# Patient Record
Sex: Female | Born: 1940 | Race: White | Hispanic: No | Smoking: Never smoker
Health system: Southern US, Community
[De-identification: ages and names within clinical notes are randomized; demographics above are authoritative.]

## PROBLEM LIST (undated history)

## (undated) DIAGNOSIS — E785 Hyperlipidemia, unspecified: Secondary | ICD-10-CM

## (undated) DIAGNOSIS — G43909 Migraine, unspecified, not intractable, without status migrainosus: Secondary | ICD-10-CM

## (undated) DIAGNOSIS — I10 Essential (primary) hypertension: Secondary | ICD-10-CM

## (undated) DIAGNOSIS — M170 Bilateral primary osteoarthritis of knee: Secondary | ICD-10-CM

## (undated) HISTORY — DX: Bilateral primary osteoarthritis of knee: M17.0

## (undated) HISTORY — PX: LEFT OOPHORECTOMY: SHX1961

## (undated) HISTORY — PX: CHOLECYSTECTOMY: SHX55

## (undated) HISTORY — DX: Migraine, unspecified, not intractable, without status migrainosus: G43.909

## (undated) HISTORY — DX: Essential (primary) hypertension: I10

## (undated) HISTORY — DX: Hyperlipidemia, unspecified: E78.5

## (undated) HISTORY — PX: BIOPSY BREAST: PRO8

---

## 1977-12-24 HISTORY — PX: VAGINAL HYSTERECTOMY: SHX2639

## 1998-12-24 HISTORY — PX: DG GALL BLADDER: HXRAD326

## 2016-12-24 HISTORY — PX: ABSCESS DRAINAGE: SHX1119

## 2017-01-29 ENCOUNTER — Other Ambulatory Visit: Payer: Self-pay | Admitting: Family Medicine

## 2017-01-29 DIAGNOSIS — M8580 Other specified disorders of bone density and structure, unspecified site: Secondary | ICD-10-CM

## 2017-01-31 ENCOUNTER — Other Ambulatory Visit: Payer: Self-pay

## 2017-02-01 ENCOUNTER — Other Ambulatory Visit: Payer: Self-pay

## 2017-02-04 ENCOUNTER — Ambulatory Visit
Admission: RE | Admit: 2017-02-04 | Discharge: 2017-02-04 | Disposition: A | Discharge status: Home / Self Care | Payer: Medicare Other | Source: Ambulatory Visit | Attending: Family Medicine | Admitting: Family Medicine

## 2017-02-04 DIAGNOSIS — M8580 Other specified disorders of bone density and structure, unspecified site: Secondary | ICD-10-CM

## 2019-11-23 ENCOUNTER — Other Ambulatory Visit: Payer: Self-pay

## 2019-11-23 DIAGNOSIS — Z20822 Contact with and (suspected) exposure to covid-19: Secondary | ICD-10-CM

## 2019-11-25 ENCOUNTER — Telehealth: Payer: Self-pay | Admitting: General Practice

## 2019-11-25 LAB — NOVEL CORONAVIRUS, NAA: SARS-CoV-2, NAA: NOT DETECTED

## 2019-11-25 NOTE — Telephone Encounter (Signed)
Negative COVID results given. Patient results "NOT Detected." Caller expressed understanding. ° °

## 2020-09-05 DIAGNOSIS — H25811 Combined forms of age-related cataract, right eye: Secondary | ICD-10-CM | POA: Diagnosis not present

## 2020-09-05 DIAGNOSIS — D3132 Benign neoplasm of left choroid: Secondary | ICD-10-CM | POA: Diagnosis not present

## 2020-09-13 DIAGNOSIS — Z23 Encounter for immunization: Secondary | ICD-10-CM | POA: Diagnosis not present

## 2020-10-10 DIAGNOSIS — G43909 Migraine, unspecified, not intractable, without status migrainosus: Secondary | ICD-10-CM | POA: Diagnosis not present

## 2020-10-10 DIAGNOSIS — F4321 Adjustment disorder with depressed mood: Secondary | ICD-10-CM | POA: Diagnosis not present

## 2020-10-10 DIAGNOSIS — T22319A Burn of third degree of unspecified forearm, initial encounter: Secondary | ICD-10-CM | POA: Diagnosis not present

## 2020-10-10 DIAGNOSIS — G4700 Insomnia, unspecified: Secondary | ICD-10-CM | POA: Diagnosis not present

## 2020-10-10 DIAGNOSIS — R000 Tachycardia, unspecified: Secondary | ICD-10-CM | POA: Diagnosis not present

## 2020-10-11 DIAGNOSIS — N3000 Acute cystitis without hematuria: Secondary | ICD-10-CM | POA: Diagnosis not present

## 2020-10-24 DIAGNOSIS — L720 Epidermal cyst: Secondary | ICD-10-CM | POA: Diagnosis not present

## 2020-10-24 DIAGNOSIS — L820 Inflamed seborrheic keratosis: Secondary | ICD-10-CM | POA: Diagnosis not present

## 2020-10-24 DIAGNOSIS — L821 Other seborrheic keratosis: Secondary | ICD-10-CM | POA: Diagnosis not present

## 2020-11-01 DIAGNOSIS — M170 Bilateral primary osteoarthritis of knee: Secondary | ICD-10-CM | POA: Diagnosis not present

## 2020-11-01 DIAGNOSIS — M7581 Other shoulder lesions, right shoulder: Secondary | ICD-10-CM | POA: Diagnosis not present

## 2020-12-24 HISTORY — PX: OTHER SURGICAL HISTORY: SHX169

## 2021-02-07 DIAGNOSIS — D485 Neoplasm of uncertain behavior of skin: Secondary | ICD-10-CM | POA: Diagnosis not present

## 2021-02-07 DIAGNOSIS — C44519 Basal cell carcinoma of skin of other part of trunk: Secondary | ICD-10-CM | POA: Diagnosis not present

## 2021-02-23 DIAGNOSIS — F4321 Adjustment disorder with depressed mood: Secondary | ICD-10-CM | POA: Diagnosis not present

## 2021-02-23 DIAGNOSIS — Z0000 Encounter for general adult medical examination without abnormal findings: Secondary | ICD-10-CM | POA: Diagnosis not present

## 2021-02-23 DIAGNOSIS — M19049 Primary osteoarthritis, unspecified hand: Secondary | ICD-10-CM | POA: Diagnosis not present

## 2021-02-23 DIAGNOSIS — G5603 Carpal tunnel syndrome, bilateral upper limbs: Secondary | ICD-10-CM | POA: Diagnosis not present

## 2021-02-23 DIAGNOSIS — M1990 Unspecified osteoarthritis, unspecified site: Secondary | ICD-10-CM | POA: Diagnosis not present

## 2021-02-23 DIAGNOSIS — Z1339 Encounter for screening examination for other mental health and behavioral disorders: Secondary | ICD-10-CM | POA: Diagnosis not present

## 2021-02-23 DIAGNOSIS — M170 Bilateral primary osteoarthritis of knee: Secondary | ICD-10-CM | POA: Diagnosis not present

## 2021-02-23 DIAGNOSIS — Z1331 Encounter for screening for depression: Secondary | ICD-10-CM | POA: Diagnosis not present

## 2021-02-23 DIAGNOSIS — C4491 Basal cell carcinoma of skin, unspecified: Secondary | ICD-10-CM | POA: Diagnosis not present

## 2021-03-22 DIAGNOSIS — L905 Scar conditions and fibrosis of skin: Secondary | ICD-10-CM | POA: Diagnosis not present

## 2021-03-22 DIAGNOSIS — C44519 Basal cell carcinoma of skin of other part of trunk: Secondary | ICD-10-CM | POA: Diagnosis not present

## 2021-04-11 DIAGNOSIS — E7800 Pure hypercholesterolemia, unspecified: Secondary | ICD-10-CM | POA: Diagnosis not present

## 2021-04-11 DIAGNOSIS — E559 Vitamin D deficiency, unspecified: Secondary | ICD-10-CM | POA: Diagnosis not present

## 2021-04-11 DIAGNOSIS — R5383 Other fatigue: Secondary | ICD-10-CM | POA: Diagnosis not present

## 2021-04-13 ENCOUNTER — Other Ambulatory Visit: Payer: Self-pay

## 2021-04-13 ENCOUNTER — Other Ambulatory Visit: Payer: Self-pay | Admitting: Family Medicine

## 2021-04-13 ENCOUNTER — Ambulatory Visit
Admission: RE | Admit: 2021-04-13 | Discharge: 2021-04-13 | Disposition: A | Discharge status: Home / Self Care | Payer: Medicare PPO | Source: Ambulatory Visit | Attending: Family Medicine | Admitting: Family Medicine

## 2021-04-13 DIAGNOSIS — M170 Bilateral primary osteoarthritis of knee: Secondary | ICD-10-CM

## 2021-04-13 DIAGNOSIS — E559 Vitamin D deficiency, unspecified: Secondary | ICD-10-CM | POA: Diagnosis not present

## 2021-04-13 DIAGNOSIS — M1711 Unilateral primary osteoarthritis, right knee: Secondary | ICD-10-CM | POA: Diagnosis not present

## 2021-04-13 DIAGNOSIS — F411 Generalized anxiety disorder: Secondary | ICD-10-CM | POA: Diagnosis not present

## 2021-04-13 DIAGNOSIS — E782 Mixed hyperlipidemia: Secondary | ICD-10-CM | POA: Diagnosis not present

## 2021-04-13 DIAGNOSIS — G5603 Carpal tunnel syndrome, bilateral upper limbs: Secondary | ICD-10-CM | POA: Diagnosis not present

## 2021-04-13 DIAGNOSIS — Z23 Encounter for immunization: Secondary | ICD-10-CM | POA: Diagnosis not present

## 2021-04-13 DIAGNOSIS — M1712 Unilateral primary osteoarthritis, left knee: Secondary | ICD-10-CM | POA: Diagnosis not present

## 2021-08-18 DIAGNOSIS — M545 Low back pain, unspecified: Secondary | ICD-10-CM | POA: Diagnosis not present

## 2021-08-18 DIAGNOSIS — M25551 Pain in right hip: Secondary | ICD-10-CM | POA: Diagnosis not present

## 2021-08-29 DIAGNOSIS — M545 Low back pain, unspecified: Secondary | ICD-10-CM | POA: Diagnosis not present

## 2021-08-29 DIAGNOSIS — M25559 Pain in unspecified hip: Secondary | ICD-10-CM | POA: Diagnosis not present

## 2021-09-05 DIAGNOSIS — S32030A Wedge compression fracture of third lumbar vertebra, initial encounter for closed fracture: Secondary | ICD-10-CM | POA: Diagnosis not present

## 2021-09-05 DIAGNOSIS — M4316 Spondylolisthesis, lumbar region: Secondary | ICD-10-CM | POA: Diagnosis not present

## 2021-09-06 DIAGNOSIS — Z23 Encounter for immunization: Secondary | ICD-10-CM | POA: Diagnosis not present

## 2021-09-06 DIAGNOSIS — S32030A Wedge compression fracture of third lumbar vertebra, initial encounter for closed fracture: Secondary | ICD-10-CM | POA: Diagnosis not present

## 2021-09-06 DIAGNOSIS — K5790 Diverticulosis of intestine, part unspecified, without perforation or abscess without bleeding: Secondary | ICD-10-CM | POA: Diagnosis not present

## 2021-09-06 DIAGNOSIS — S32050A Wedge compression fracture of fifth lumbar vertebra, initial encounter for closed fracture: Secondary | ICD-10-CM | POA: Diagnosis not present

## 2021-09-06 DIAGNOSIS — M419 Scoliosis, unspecified: Secondary | ICD-10-CM | POA: Diagnosis not present

## 2021-09-06 DIAGNOSIS — E559 Vitamin D deficiency, unspecified: Secondary | ICD-10-CM | POA: Diagnosis not present

## 2021-09-06 DIAGNOSIS — M810 Age-related osteoporosis without current pathological fracture: Secondary | ICD-10-CM | POA: Diagnosis not present

## 2021-09-06 DIAGNOSIS — M8580 Other specified disorders of bone density and structure, unspecified site: Secondary | ICD-10-CM | POA: Diagnosis not present

## 2021-09-06 DIAGNOSIS — R030 Elevated blood-pressure reading, without diagnosis of hypertension: Secondary | ICD-10-CM | POA: Diagnosis not present

## 2021-09-07 DIAGNOSIS — H25811 Combined forms of age-related cataract, right eye: Secondary | ICD-10-CM | POA: Diagnosis not present

## 2021-09-07 DIAGNOSIS — D3132 Benign neoplasm of left choroid: Secondary | ICD-10-CM | POA: Diagnosis not present

## 2021-09-27 DIAGNOSIS — Z23 Encounter for immunization: Secondary | ICD-10-CM | POA: Diagnosis not present

## 2021-10-17 DIAGNOSIS — F411 Generalized anxiety disorder: Secondary | ICD-10-CM | POA: Diagnosis not present

## 2021-10-17 DIAGNOSIS — G4700 Insomnia, unspecified: Secondary | ICD-10-CM | POA: Diagnosis not present

## 2021-10-17 DIAGNOSIS — E559 Vitamin D deficiency, unspecified: Secondary | ICD-10-CM | POA: Diagnosis not present

## 2021-10-17 DIAGNOSIS — F331 Major depressive disorder, recurrent, moderate: Secondary | ICD-10-CM | POA: Diagnosis not present

## 2021-10-17 DIAGNOSIS — M810 Age-related osteoporosis without current pathological fracture: Secondary | ICD-10-CM | POA: Diagnosis not present

## 2021-10-17 DIAGNOSIS — M170 Bilateral primary osteoarthritis of knee: Secondary | ICD-10-CM | POA: Diagnosis not present

## 2021-10-17 DIAGNOSIS — S32030A Wedge compression fracture of third lumbar vertebra, initial encounter for closed fracture: Secondary | ICD-10-CM | POA: Diagnosis not present

## 2021-10-25 DIAGNOSIS — S76019D Strain of muscle, fascia and tendon of unspecified hip, subsequent encounter: Secondary | ICD-10-CM | POA: Diagnosis not present

## 2021-10-25 DIAGNOSIS — R030 Elevated blood-pressure reading, without diagnosis of hypertension: Secondary | ICD-10-CM | POA: Diagnosis not present

## 2021-10-25 DIAGNOSIS — Z6832 Body mass index (BMI) 32.0-32.9, adult: Secondary | ICD-10-CM | POA: Diagnosis not present

## 2021-10-25 DIAGNOSIS — S32030A Wedge compression fracture of third lumbar vertebra, initial encounter for closed fracture: Secondary | ICD-10-CM | POA: Diagnosis not present

## 2021-11-01 DIAGNOSIS — M170 Bilateral primary osteoarthritis of knee: Secondary | ICD-10-CM | POA: Diagnosis not present

## 2021-11-03 DIAGNOSIS — S76019D Strain of muscle, fascia and tendon of unspecified hip, subsequent encounter: Secondary | ICD-10-CM | POA: Diagnosis not present

## 2021-11-03 DIAGNOSIS — M25559 Pain in unspecified hip: Secondary | ICD-10-CM | POA: Diagnosis not present

## 2021-11-07 DIAGNOSIS — S76019D Strain of muscle, fascia and tendon of unspecified hip, subsequent encounter: Secondary | ICD-10-CM | POA: Diagnosis not present

## 2021-11-07 DIAGNOSIS — M25559 Pain in unspecified hip: Secondary | ICD-10-CM | POA: Diagnosis not present

## 2021-11-08 DIAGNOSIS — M170 Bilateral primary osteoarthritis of knee: Secondary | ICD-10-CM | POA: Diagnosis not present

## 2021-11-09 DIAGNOSIS — S76019D Strain of muscle, fascia and tendon of unspecified hip, subsequent encounter: Secondary | ICD-10-CM | POA: Diagnosis not present

## 2021-11-09 DIAGNOSIS — M25559 Pain in unspecified hip: Secondary | ICD-10-CM | POA: Diagnosis not present

## 2021-11-15 DIAGNOSIS — I10 Essential (primary) hypertension: Secondary | ICD-10-CM | POA: Diagnosis not present

## 2021-11-15 DIAGNOSIS — M170 Bilateral primary osteoarthritis of knee: Secondary | ICD-10-CM | POA: Diagnosis not present

## 2021-11-20 DIAGNOSIS — S76019D Strain of muscle, fascia and tendon of unspecified hip, subsequent encounter: Secondary | ICD-10-CM | POA: Diagnosis not present

## 2021-11-20 DIAGNOSIS — M545 Low back pain, unspecified: Secondary | ICD-10-CM | POA: Diagnosis not present

## 2021-11-21 DIAGNOSIS — S76019D Strain of muscle, fascia and tendon of unspecified hip, subsequent encounter: Secondary | ICD-10-CM | POA: Diagnosis not present

## 2021-11-21 DIAGNOSIS — M25559 Pain in unspecified hip: Secondary | ICD-10-CM | POA: Diagnosis not present

## 2021-11-23 DIAGNOSIS — M25559 Pain in unspecified hip: Secondary | ICD-10-CM | POA: Diagnosis not present

## 2021-11-23 DIAGNOSIS — S76019D Strain of muscle, fascia and tendon of unspecified hip, subsequent encounter: Secondary | ICD-10-CM | POA: Diagnosis not present

## 2021-11-29 DIAGNOSIS — M25559 Pain in unspecified hip: Secondary | ICD-10-CM | POA: Diagnosis not present

## 2021-11-29 DIAGNOSIS — S76019D Strain of muscle, fascia and tendon of unspecified hip, subsequent encounter: Secondary | ICD-10-CM | POA: Diagnosis not present

## 2021-12-08 DIAGNOSIS — S32030D Wedge compression fracture of third lumbar vertebra, subsequent encounter for fracture with routine healing: Secondary | ICD-10-CM | POA: Diagnosis not present

## 2021-12-19 DIAGNOSIS — R002 Palpitations: Secondary | ICD-10-CM | POA: Diagnosis not present

## 2021-12-19 DIAGNOSIS — R21 Rash and other nonspecific skin eruption: Secondary | ICD-10-CM | POA: Diagnosis not present

## 2021-12-19 DIAGNOSIS — M539 Dorsopathy, unspecified: Secondary | ICD-10-CM | POA: Diagnosis not present

## 2021-12-19 DIAGNOSIS — I10 Essential (primary) hypertension: Secondary | ICD-10-CM | POA: Diagnosis not present

## 2021-12-19 DIAGNOSIS — R600 Localized edema: Secondary | ICD-10-CM | POA: Diagnosis not present

## 2021-12-19 DIAGNOSIS — F411 Generalized anxiety disorder: Secondary | ICD-10-CM | POA: Diagnosis not present

## 2021-12-19 DIAGNOSIS — Z23 Encounter for immunization: Secondary | ICD-10-CM | POA: Diagnosis not present

## 2021-12-19 DIAGNOSIS — N952 Postmenopausal atrophic vaginitis: Secondary | ICD-10-CM | POA: Diagnosis not present

## 2022-02-13 DIAGNOSIS — M19049 Primary osteoarthritis, unspecified hand: Secondary | ICD-10-CM | POA: Diagnosis not present

## 2022-02-13 DIAGNOSIS — S32030A Wedge compression fracture of third lumbar vertebra, initial encounter for closed fracture: Secondary | ICD-10-CM | POA: Diagnosis not present

## 2022-02-13 DIAGNOSIS — F331 Major depressive disorder, recurrent, moderate: Secondary | ICD-10-CM | POA: Diagnosis not present

## 2022-02-13 DIAGNOSIS — M170 Bilateral primary osteoarthritis of knee: Secondary | ICD-10-CM | POA: Diagnosis not present

## 2022-02-13 DIAGNOSIS — K589 Irritable bowel syndrome without diarrhea: Secondary | ICD-10-CM | POA: Diagnosis not present

## 2022-02-13 DIAGNOSIS — I10 Essential (primary) hypertension: Secondary | ICD-10-CM | POA: Diagnosis not present

## 2022-02-15 DIAGNOSIS — L578 Other skin changes due to chronic exposure to nonionizing radiation: Secondary | ICD-10-CM | POA: Diagnosis not present

## 2022-02-15 DIAGNOSIS — L719 Rosacea, unspecified: Secondary | ICD-10-CM | POA: Diagnosis not present

## 2022-02-15 DIAGNOSIS — L821 Other seborrheic keratosis: Secondary | ICD-10-CM | POA: Diagnosis not present

## 2022-02-15 DIAGNOSIS — L814 Other melanin hyperpigmentation: Secondary | ICD-10-CM | POA: Diagnosis not present

## 2022-02-15 DIAGNOSIS — B079 Viral wart, unspecified: Secondary | ICD-10-CM | POA: Diagnosis not present

## 2022-02-15 DIAGNOSIS — L720 Epidermal cyst: Secondary | ICD-10-CM | POA: Diagnosis not present

## 2022-02-15 DIAGNOSIS — Z23 Encounter for immunization: Secondary | ICD-10-CM | POA: Diagnosis not present

## 2022-02-21 DIAGNOSIS — Z6834 Body mass index (BMI) 34.0-34.9, adult: Secondary | ICD-10-CM | POA: Diagnosis not present

## 2022-02-21 DIAGNOSIS — S32030D Wedge compression fracture of third lumbar vertebra, subsequent encounter for fracture with routine healing: Secondary | ICD-10-CM | POA: Diagnosis not present

## 2022-02-21 DIAGNOSIS — I10 Essential (primary) hypertension: Secondary | ICD-10-CM | POA: Diagnosis not present

## 2022-03-01 DIAGNOSIS — R197 Diarrhea, unspecified: Secondary | ICD-10-CM | POA: Diagnosis not present

## 2022-03-01 DIAGNOSIS — M8580 Other specified disorders of bone density and structure, unspecified site: Secondary | ICD-10-CM | POA: Diagnosis not present

## 2022-03-01 DIAGNOSIS — Z0000 Encounter for general adult medical examination without abnormal findings: Secondary | ICD-10-CM | POA: Diagnosis not present

## 2022-03-01 DIAGNOSIS — R5383 Other fatigue: Secondary | ICD-10-CM | POA: Diagnosis not present

## 2022-03-01 DIAGNOSIS — Z1331 Encounter for screening for depression: Secondary | ICD-10-CM | POA: Diagnosis not present

## 2022-03-01 DIAGNOSIS — E559 Vitamin D deficiency, unspecified: Secondary | ICD-10-CM | POA: Diagnosis not present

## 2022-03-01 DIAGNOSIS — Z1339 Encounter for screening examination for other mental health and behavioral disorders: Secondary | ICD-10-CM | POA: Diagnosis not present

## 2022-03-01 DIAGNOSIS — S32030A Wedge compression fracture of third lumbar vertebra, initial encounter for closed fracture: Secondary | ICD-10-CM | POA: Diagnosis not present

## 2022-03-14 ENCOUNTER — Other Ambulatory Visit: Payer: Self-pay | Admitting: Family Medicine

## 2022-03-14 DIAGNOSIS — E559 Vitamin D deficiency, unspecified: Secondary | ICD-10-CM

## 2022-03-14 DIAGNOSIS — M8580 Other specified disorders of bone density and structure, unspecified site: Secondary | ICD-10-CM

## 2022-06-06 DIAGNOSIS — R5383 Other fatigue: Secondary | ICD-10-CM | POA: Diagnosis not present

## 2022-06-06 DIAGNOSIS — E559 Vitamin D deficiency, unspecified: Secondary | ICD-10-CM | POA: Diagnosis not present

## 2022-06-07 DIAGNOSIS — J329 Chronic sinusitis, unspecified: Secondary | ICD-10-CM | POA: Diagnosis not present

## 2022-06-07 DIAGNOSIS — J069 Acute upper respiratory infection, unspecified: Secondary | ICD-10-CM | POA: Diagnosis not present

## 2022-06-07 DIAGNOSIS — B9689 Other specified bacterial agents as the cause of diseases classified elsewhere: Secondary | ICD-10-CM | POA: Diagnosis not present

## 2022-07-02 DIAGNOSIS — E559 Vitamin D deficiency, unspecified: Secondary | ICD-10-CM | POA: Diagnosis not present

## 2022-07-02 DIAGNOSIS — R5383 Other fatigue: Secondary | ICD-10-CM | POA: Diagnosis not present

## 2022-07-02 DIAGNOSIS — F331 Major depressive disorder, recurrent, moderate: Secondary | ICD-10-CM | POA: Diagnosis not present

## 2022-07-02 DIAGNOSIS — F411 Generalized anxiety disorder: Secondary | ICD-10-CM | POA: Diagnosis not present

## 2022-07-02 DIAGNOSIS — G4700 Insomnia, unspecified: Secondary | ICD-10-CM | POA: Diagnosis not present

## 2022-07-02 DIAGNOSIS — I10 Essential (primary) hypertension: Secondary | ICD-10-CM | POA: Diagnosis not present

## 2022-07-02 DIAGNOSIS — M810 Age-related osteoporosis without current pathological fracture: Secondary | ICD-10-CM | POA: Diagnosis not present

## 2022-08-07 DIAGNOSIS — D3132 Benign neoplasm of left choroid: Secondary | ICD-10-CM | POA: Diagnosis not present

## 2022-10-03 ENCOUNTER — Ambulatory Visit
Admission: RE | Admit: 2022-10-03 | Discharge: 2022-10-03 | Disposition: A | Discharge status: Home / Self Care | Payer: Medicare PPO | Source: Ambulatory Visit | Attending: Family Medicine | Admitting: Family Medicine

## 2022-10-03 DIAGNOSIS — M8580 Other specified disorders of bone density and structure, unspecified site: Secondary | ICD-10-CM

## 2022-10-03 DIAGNOSIS — E559 Vitamin D deficiency, unspecified: Secondary | ICD-10-CM

## 2022-10-03 DIAGNOSIS — M85832 Other specified disorders of bone density and structure, left forearm: Secondary | ICD-10-CM | POA: Diagnosis not present

## 2022-10-03 DIAGNOSIS — Z780 Asymptomatic menopausal state: Secondary | ICD-10-CM | POA: Diagnosis not present

## 2022-10-03 DIAGNOSIS — M810 Age-related osteoporosis without current pathological fracture: Secondary | ICD-10-CM | POA: Diagnosis not present

## 2022-10-09 DIAGNOSIS — E559 Vitamin D deficiency, unspecified: Secondary | ICD-10-CM | POA: Diagnosis not present

## 2022-10-09 DIAGNOSIS — M545 Low back pain, unspecified: Secondary | ICD-10-CM | POA: Diagnosis not present

## 2022-10-09 DIAGNOSIS — M810 Age-related osteoporosis without current pathological fracture: Secondary | ICD-10-CM | POA: Diagnosis not present

## 2022-10-09 DIAGNOSIS — E782 Mixed hyperlipidemia: Secondary | ICD-10-CM | POA: Diagnosis not present

## 2022-10-09 DIAGNOSIS — Z23 Encounter for immunization: Secondary | ICD-10-CM | POA: Diagnosis not present

## 2022-10-09 DIAGNOSIS — I10 Essential (primary) hypertension: Secondary | ICD-10-CM | POA: Diagnosis not present

## 2023-01-04 DIAGNOSIS — R5383 Other fatigue: Secondary | ICD-10-CM | POA: Diagnosis not present

## 2023-01-04 DIAGNOSIS — E559 Vitamin D deficiency, unspecified: Secondary | ICD-10-CM | POA: Diagnosis not present

## 2023-01-04 DIAGNOSIS — R739 Hyperglycemia, unspecified: Secondary | ICD-10-CM | POA: Diagnosis not present

## 2023-01-04 DIAGNOSIS — E782 Mixed hyperlipidemia: Secondary | ICD-10-CM | POA: Diagnosis not present

## 2023-01-04 DIAGNOSIS — I10 Essential (primary) hypertension: Secondary | ICD-10-CM | POA: Diagnosis not present

## 2023-01-04 DIAGNOSIS — E663 Overweight: Secondary | ICD-10-CM | POA: Diagnosis not present

## 2023-01-07 DIAGNOSIS — I10 Essential (primary) hypertension: Secondary | ICD-10-CM | POA: Diagnosis not present

## 2023-01-07 DIAGNOSIS — E782 Mixed hyperlipidemia: Secondary | ICD-10-CM | POA: Diagnosis not present

## 2023-01-07 DIAGNOSIS — M170 Bilateral primary osteoarthritis of knee: Secondary | ICD-10-CM | POA: Diagnosis not present

## 2023-01-07 DIAGNOSIS — E559 Vitamin D deficiency, unspecified: Secondary | ICD-10-CM | POA: Diagnosis not present

## 2023-01-22 DIAGNOSIS — M170 Bilateral primary osteoarthritis of knee: Secondary | ICD-10-CM | POA: Diagnosis not present

## 2023-01-27 IMAGING — CR DG KNEE 3 VIEWS*L*
3 series · 3 of 3 positions shown · non-contrast
Comparison: None.

CLINICAL DATA: Osteoarthritis.  Chronic bilateral knee pain.

EXAM:
LEFT KNEE - 3 VIEW

[w knee ap left]
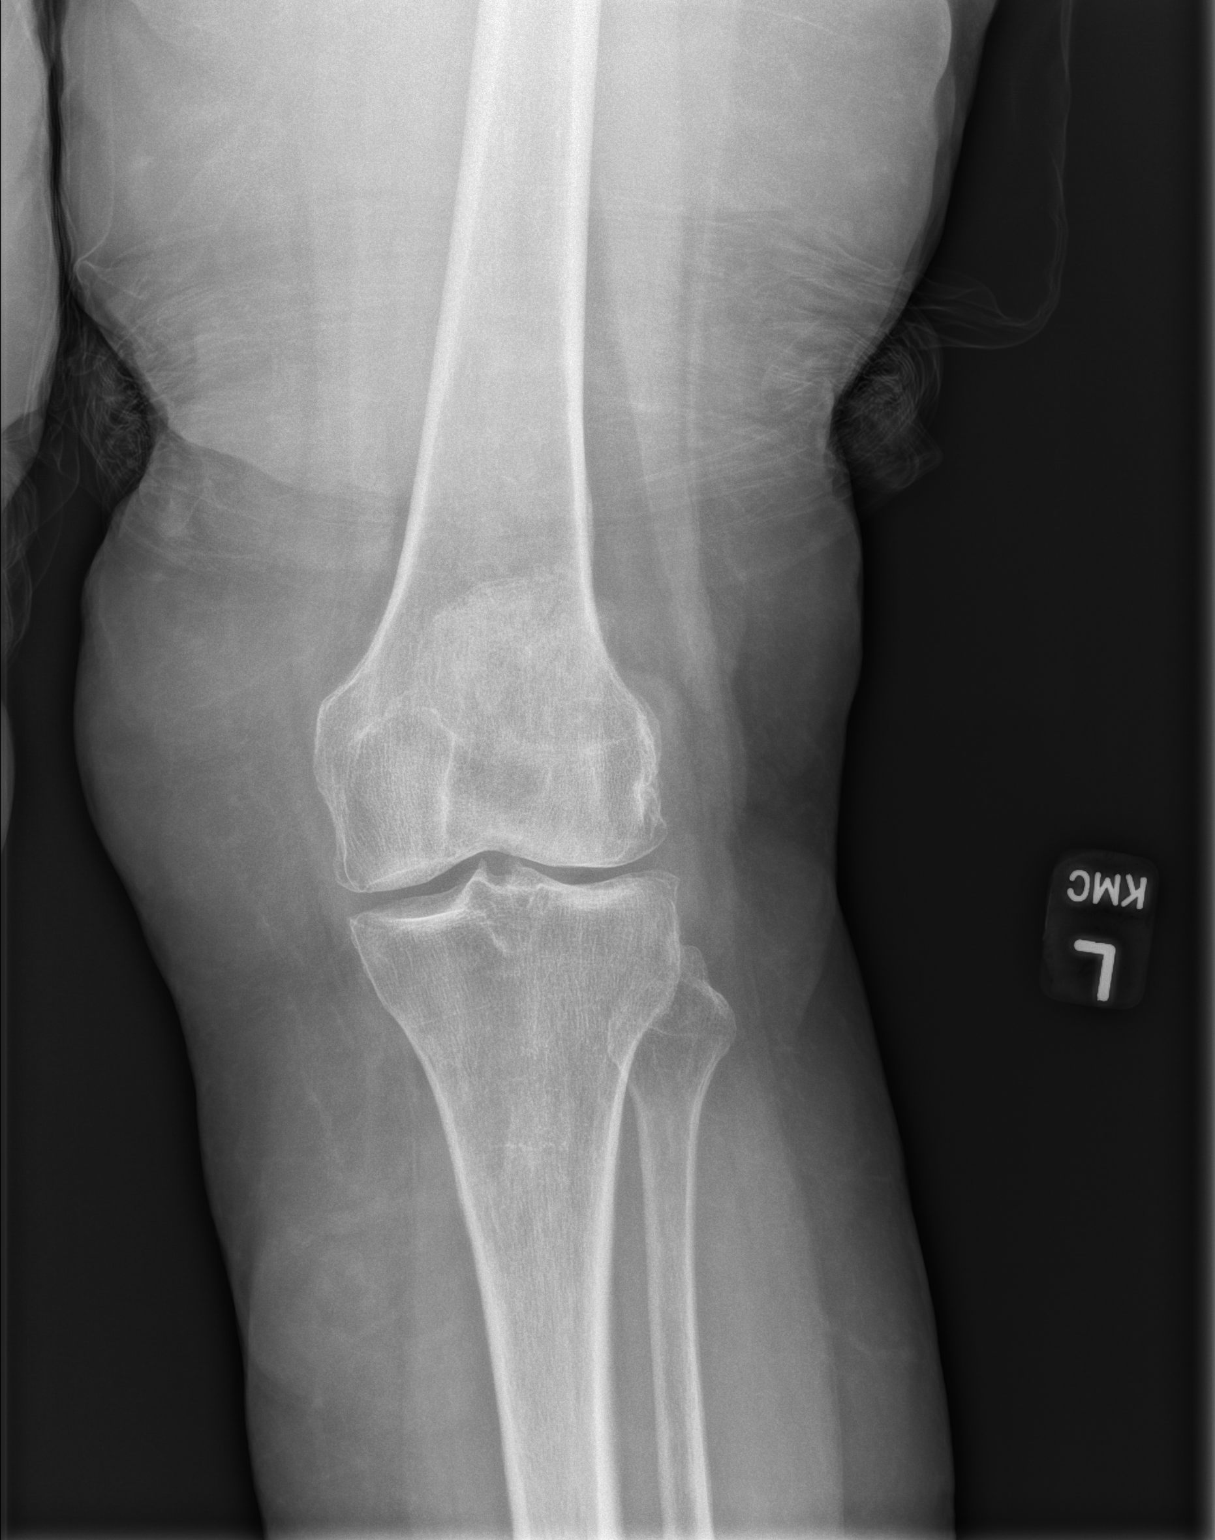

[w knee lat left]
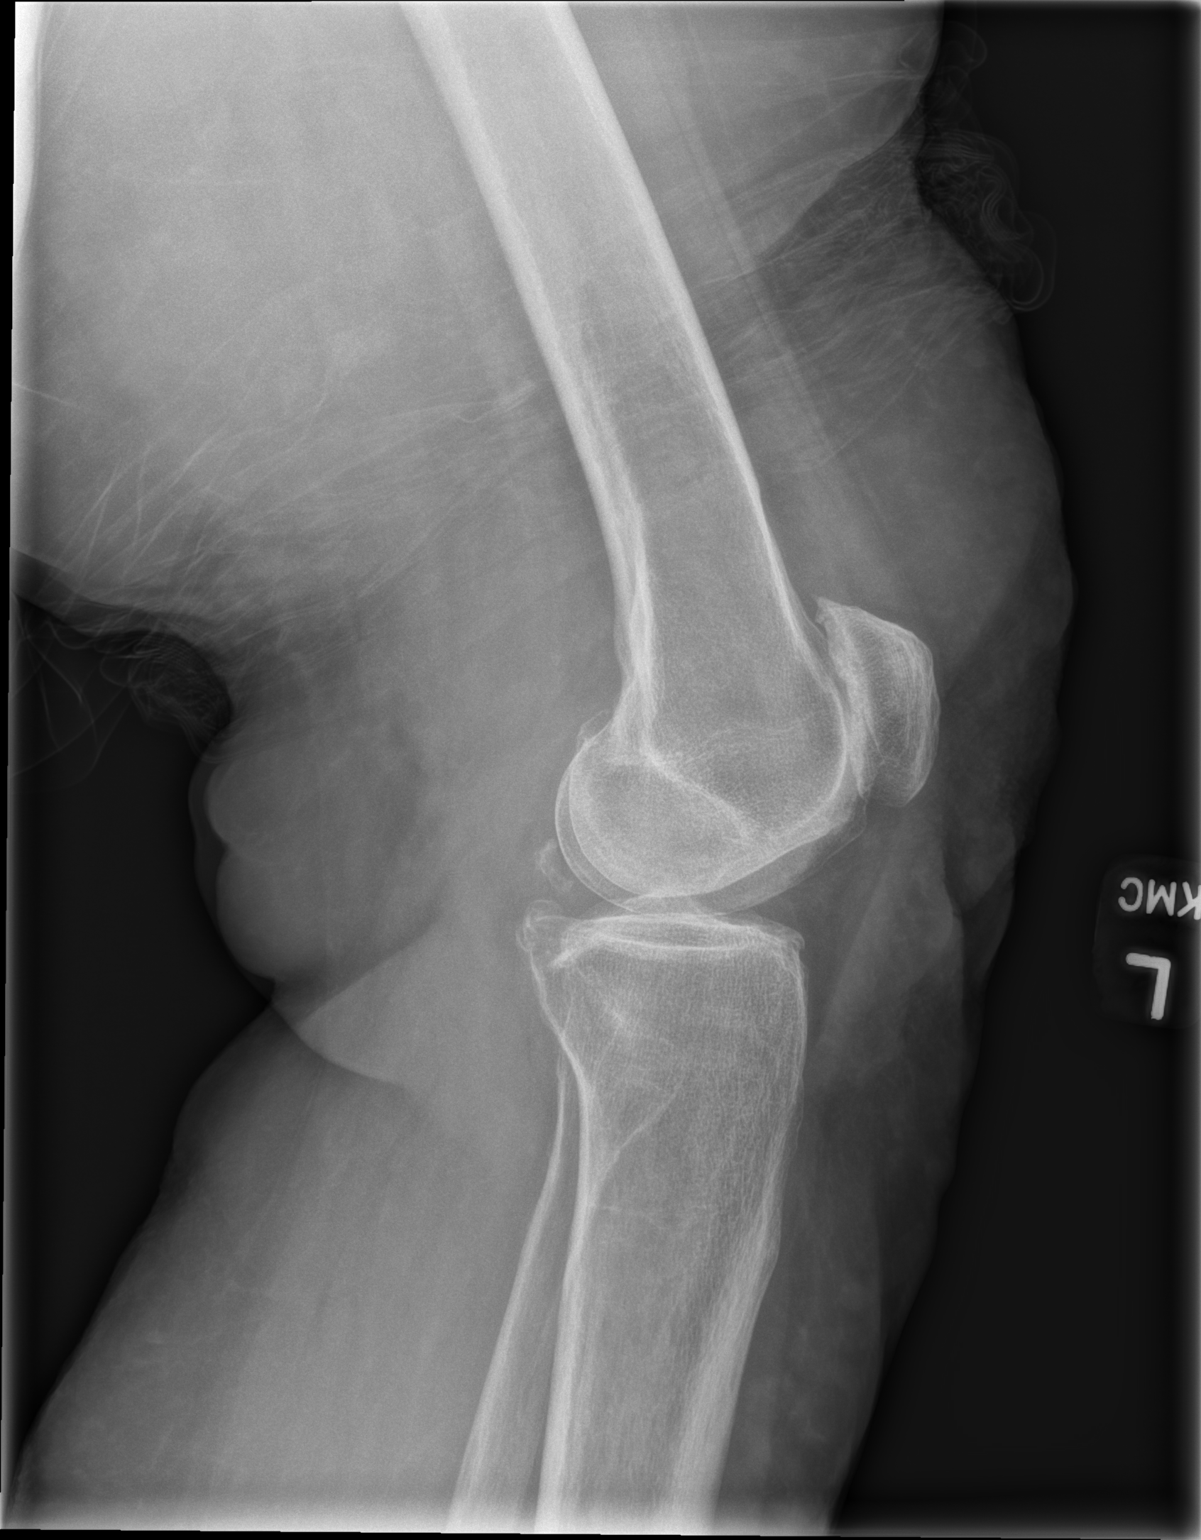

[x knee sunrise left]
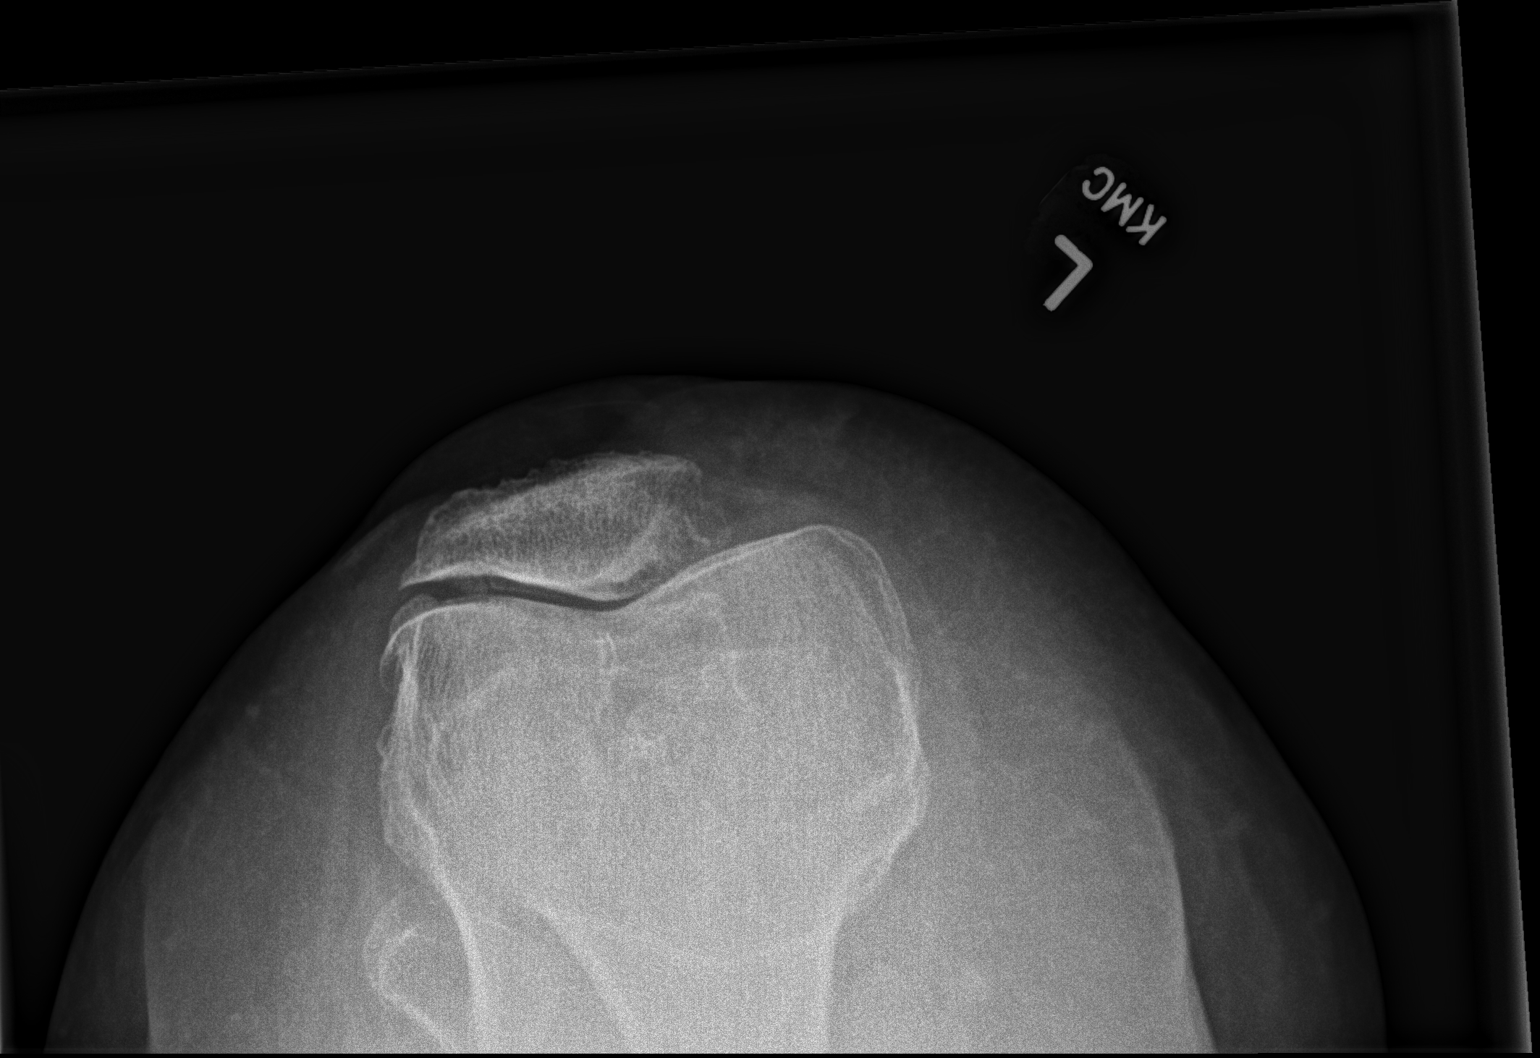

[3 of 3 positions shown; findings below may reference images not displayed]

FINDINGS: Mild subchondral sclerosis and trace osteophytosis in all 3
compartments of the knee. No joint effusion or fracture.
IMPRESSION: Mild tricompartment osteoarthritis.

## 2023-01-29 DIAGNOSIS — M170 Bilateral primary osteoarthritis of knee: Secondary | ICD-10-CM | POA: Diagnosis not present

## 2023-02-05 DIAGNOSIS — F411 Generalized anxiety disorder: Secondary | ICD-10-CM | POA: Diagnosis not present

## 2023-02-05 DIAGNOSIS — G4700 Insomnia, unspecified: Secondary | ICD-10-CM | POA: Diagnosis not present

## 2023-02-05 DIAGNOSIS — M170 Bilateral primary osteoarthritis of knee: Secondary | ICD-10-CM | POA: Diagnosis not present

## 2023-02-05 DIAGNOSIS — F331 Major depressive disorder, recurrent, moderate: Secondary | ICD-10-CM | POA: Diagnosis not present

## 2023-02-18 DIAGNOSIS — M170 Bilateral primary osteoarthritis of knee: Secondary | ICD-10-CM | POA: Diagnosis not present

## 2023-02-18 DIAGNOSIS — M19071 Primary osteoarthritis, right ankle and foot: Secondary | ICD-10-CM | POA: Diagnosis not present

## 2023-03-04 DIAGNOSIS — Z0000 Encounter for general adult medical examination without abnormal findings: Secondary | ICD-10-CM | POA: Diagnosis not present

## 2023-03-04 DIAGNOSIS — M810 Age-related osteoporosis without current pathological fracture: Secondary | ICD-10-CM | POA: Diagnosis not present

## 2023-03-04 DIAGNOSIS — M19079 Primary osteoarthritis, unspecified ankle and foot: Secondary | ICD-10-CM | POA: Diagnosis not present

## 2023-03-04 DIAGNOSIS — F411 Generalized anxiety disorder: Secondary | ICD-10-CM | POA: Diagnosis not present

## 2023-03-04 DIAGNOSIS — Z1331 Encounter for screening for depression: Secondary | ICD-10-CM | POA: Diagnosis not present

## 2023-03-04 DIAGNOSIS — G4700 Insomnia, unspecified: Secondary | ICD-10-CM | POA: Diagnosis not present

## 2023-03-04 DIAGNOSIS — M170 Bilateral primary osteoarthritis of knee: Secondary | ICD-10-CM | POA: Diagnosis not present

## 2023-03-04 DIAGNOSIS — Z1339 Encounter for screening examination for other mental health and behavioral disorders: Secondary | ICD-10-CM | POA: Diagnosis not present

## 2023-03-11 DIAGNOSIS — M25562 Pain in left knee: Secondary | ICD-10-CM | POA: Diagnosis not present

## 2023-03-11 DIAGNOSIS — M25561 Pain in right knee: Secondary | ICD-10-CM | POA: Diagnosis not present

## 2023-03-20 DIAGNOSIS — H903 Sensorineural hearing loss, bilateral: Secondary | ICD-10-CM | POA: Diagnosis not present

## 2023-03-27 DIAGNOSIS — L814 Other melanin hyperpigmentation: Secondary | ICD-10-CM | POA: Diagnosis not present

## 2023-03-27 DIAGNOSIS — L719 Rosacea, unspecified: Secondary | ICD-10-CM | POA: Diagnosis not present

## 2023-03-27 DIAGNOSIS — Z85828 Personal history of other malignant neoplasm of skin: Secondary | ICD-10-CM | POA: Diagnosis not present

## 2023-03-27 DIAGNOSIS — L821 Other seborrheic keratosis: Secondary | ICD-10-CM | POA: Diagnosis not present

## 2023-03-27 DIAGNOSIS — L578 Other skin changes due to chronic exposure to nonionizing radiation: Secondary | ICD-10-CM | POA: Diagnosis not present

## 2023-03-28 DIAGNOSIS — M25561 Pain in right knee: Secondary | ICD-10-CM | POA: Diagnosis not present

## 2023-03-28 DIAGNOSIS — M25562 Pain in left knee: Secondary | ICD-10-CM | POA: Diagnosis not present

## 2023-04-01 DIAGNOSIS — M25561 Pain in right knee: Secondary | ICD-10-CM | POA: Diagnosis not present

## 2023-04-01 DIAGNOSIS — M25562 Pain in left knee: Secondary | ICD-10-CM | POA: Diagnosis not present

## 2023-04-04 DIAGNOSIS — M25561 Pain in right knee: Secondary | ICD-10-CM | POA: Diagnosis not present

## 2023-04-04 DIAGNOSIS — M25562 Pain in left knee: Secondary | ICD-10-CM | POA: Diagnosis not present

## 2023-04-26 DIAGNOSIS — N390 Urinary tract infection, site not specified: Secondary | ICD-10-CM | POA: Diagnosis not present

## 2023-04-26 DIAGNOSIS — J309 Allergic rhinitis, unspecified: Secondary | ICD-10-CM | POA: Diagnosis not present

## 2023-04-26 DIAGNOSIS — N3000 Acute cystitis without hematuria: Secondary | ICD-10-CM | POA: Diagnosis not present

## 2023-04-26 DIAGNOSIS — M25561 Pain in right knee: Secondary | ICD-10-CM | POA: Diagnosis not present

## 2023-04-26 DIAGNOSIS — I10 Essential (primary) hypertension: Secondary | ICD-10-CM | POA: Diagnosis not present

## 2023-05-02 DIAGNOSIS — M25562 Pain in left knee: Secondary | ICD-10-CM | POA: Diagnosis not present

## 2023-05-02 DIAGNOSIS — M25561 Pain in right knee: Secondary | ICD-10-CM | POA: Diagnosis not present

## 2023-05-06 DIAGNOSIS — M25562 Pain in left knee: Secondary | ICD-10-CM | POA: Diagnosis not present

## 2023-05-06 DIAGNOSIS — M25561 Pain in right knee: Secondary | ICD-10-CM | POA: Diagnosis not present

## 2023-05-09 DIAGNOSIS — M25561 Pain in right knee: Secondary | ICD-10-CM | POA: Diagnosis not present

## 2023-05-09 DIAGNOSIS — M25562 Pain in left knee: Secondary | ICD-10-CM | POA: Diagnosis not present

## 2023-05-10 DIAGNOSIS — F411 Generalized anxiety disorder: Secondary | ICD-10-CM | POA: Diagnosis not present

## 2023-05-10 DIAGNOSIS — J309 Allergic rhinitis, unspecified: Secondary | ICD-10-CM | POA: Diagnosis not present

## 2023-05-10 DIAGNOSIS — M810 Age-related osteoporosis without current pathological fracture: Secondary | ICD-10-CM | POA: Diagnosis not present

## 2023-05-10 DIAGNOSIS — F331 Major depressive disorder, recurrent, moderate: Secondary | ICD-10-CM | POA: Diagnosis not present

## 2023-05-10 DIAGNOSIS — I10 Essential (primary) hypertension: Secondary | ICD-10-CM | POA: Diagnosis not present

## 2023-05-13 DIAGNOSIS — M25562 Pain in left knee: Secondary | ICD-10-CM | POA: Diagnosis not present

## 2023-05-13 DIAGNOSIS — M25561 Pain in right knee: Secondary | ICD-10-CM | POA: Diagnosis not present

## 2023-05-16 DIAGNOSIS — M25562 Pain in left knee: Secondary | ICD-10-CM | POA: Diagnosis not present

## 2023-05-16 DIAGNOSIS — M25561 Pain in right knee: Secondary | ICD-10-CM | POA: Diagnosis not present

## 2023-05-21 DIAGNOSIS — M25562 Pain in left knee: Secondary | ICD-10-CM | POA: Diagnosis not present

## 2023-05-21 DIAGNOSIS — M25561 Pain in right knee: Secondary | ICD-10-CM | POA: Diagnosis not present

## 2023-05-23 DIAGNOSIS — M25562 Pain in left knee: Secondary | ICD-10-CM | POA: Diagnosis not present

## 2023-05-23 DIAGNOSIS — M25561 Pain in right knee: Secondary | ICD-10-CM | POA: Diagnosis not present

## 2023-07-09 ENCOUNTER — Other Ambulatory Visit: Payer: Self-pay | Admitting: Family Medicine

## 2023-07-09 DIAGNOSIS — R519 Headache, unspecified: Secondary | ICD-10-CM | POA: Diagnosis not present

## 2023-07-09 DIAGNOSIS — R439 Unspecified disturbances of smell and taste: Secondary | ICD-10-CM | POA: Diagnosis not present

## 2023-07-09 DIAGNOSIS — E860 Dehydration: Secondary | ICD-10-CM | POA: Diagnosis not present

## 2023-07-09 DIAGNOSIS — I10 Essential (primary) hypertension: Secondary | ICD-10-CM | POA: Diagnosis not present

## 2023-07-09 DIAGNOSIS — F411 Generalized anxiety disorder: Secondary | ICD-10-CM | POA: Diagnosis not present

## 2023-07-09 DIAGNOSIS — R413 Other amnesia: Secondary | ICD-10-CM

## 2023-07-09 DIAGNOSIS — Z1159 Encounter for screening for other viral diseases: Secondary | ICD-10-CM | POA: Diagnosis not present

## 2023-07-09 DIAGNOSIS — R410 Disorientation, unspecified: Secondary | ICD-10-CM | POA: Diagnosis not present

## 2023-07-12 ENCOUNTER — Ambulatory Visit
Admission: RE | Admit: 2023-07-12 | Discharge: 2023-07-12 | Disposition: A | Discharge status: Home / Self Care | Payer: Medicare PPO | Source: Ambulatory Visit | Attending: Family Medicine | Admitting: Family Medicine

## 2023-07-12 DIAGNOSIS — R413 Other amnesia: Secondary | ICD-10-CM

## 2023-07-12 MED ORDER — IOPAMIDOL (ISOVUE-300) INJECTION 61%
75.0000 mL | Freq: Once | INTRAVENOUS | Status: AC | PRN
  Administered 2023-07-12: 75 mL via INTRAVENOUS

## 2023-07-19 DIAGNOSIS — M545 Low back pain, unspecified: Secondary | ICD-10-CM | POA: Diagnosis not present

## 2023-07-19 DIAGNOSIS — M25551 Pain in right hip: Secondary | ICD-10-CM | POA: Diagnosis not present

## 2023-07-19 DIAGNOSIS — M1711 Unilateral primary osteoarthritis, right knee: Secondary | ICD-10-CM | POA: Diagnosis not present

## 2023-07-24 DIAGNOSIS — J40 Bronchitis, not specified as acute or chronic: Secondary | ICD-10-CM | POA: Diagnosis not present

## 2023-07-24 DIAGNOSIS — I10 Essential (primary) hypertension: Secondary | ICD-10-CM | POA: Diagnosis not present

## 2023-07-24 DIAGNOSIS — Z1159 Encounter for screening for other viral diseases: Secondary | ICD-10-CM | POA: Diagnosis not present

## 2023-07-24 DIAGNOSIS — J069 Acute upper respiratory infection, unspecified: Secondary | ICD-10-CM | POA: Diagnosis not present

## 2023-07-29 ENCOUNTER — Other Ambulatory Visit: Payer: Self-pay | Admitting: Family Medicine

## 2023-07-29 ENCOUNTER — Ambulatory Visit: Admission: RE | Admit: 2023-07-29 | Payer: Medicare PPO | Source: Ambulatory Visit

## 2023-07-29 DIAGNOSIS — J40 Bronchitis, not specified as acute or chronic: Secondary | ICD-10-CM

## 2023-07-29 DIAGNOSIS — Z1159 Encounter for screening for other viral diseases: Secondary | ICD-10-CM | POA: Diagnosis not present

## 2023-07-29 DIAGNOSIS — R5383 Other fatigue: Secondary | ICD-10-CM | POA: Diagnosis not present

## 2023-07-29 DIAGNOSIS — I10 Essential (primary) hypertension: Secondary | ICD-10-CM | POA: Diagnosis not present

## 2023-07-31 DIAGNOSIS — J069 Acute upper respiratory infection, unspecified: Secondary | ICD-10-CM | POA: Diagnosis not present

## 2023-08-13 DIAGNOSIS — J329 Chronic sinusitis, unspecified: Secondary | ICD-10-CM | POA: Diagnosis not present

## 2023-08-13 DIAGNOSIS — Z1159 Encounter for screening for other viral diseases: Secondary | ICD-10-CM | POA: Diagnosis not present

## 2023-08-13 DIAGNOSIS — B9689 Other specified bacterial agents as the cause of diseases classified elsewhere: Secondary | ICD-10-CM | POA: Diagnosis not present

## 2023-08-13 DIAGNOSIS — J40 Bronchitis, not specified as acute or chronic: Secondary | ICD-10-CM | POA: Diagnosis not present

## 2023-08-13 DIAGNOSIS — J069 Acute upper respiratory infection, unspecified: Secondary | ICD-10-CM | POA: Diagnosis not present

## 2023-08-14 DIAGNOSIS — J189 Pneumonia, unspecified organism: Secondary | ICD-10-CM | POA: Diagnosis not present

## 2023-08-14 DIAGNOSIS — J40 Bronchitis, not specified as acute or chronic: Secondary | ICD-10-CM | POA: Diagnosis not present

## 2023-08-14 DIAGNOSIS — B9689 Other specified bacterial agents as the cause of diseases classified elsewhere: Secondary | ICD-10-CM | POA: Diagnosis not present

## 2023-08-14 DIAGNOSIS — J329 Chronic sinusitis, unspecified: Secondary | ICD-10-CM | POA: Diagnosis not present

## 2023-08-15 DIAGNOSIS — J40 Bronchitis, not specified as acute or chronic: Secondary | ICD-10-CM | POA: Diagnosis not present

## 2023-08-15 DIAGNOSIS — J189 Pneumonia, unspecified organism: Secondary | ICD-10-CM | POA: Diagnosis not present

## 2023-08-15 DIAGNOSIS — J329 Chronic sinusitis, unspecified: Secondary | ICD-10-CM | POA: Diagnosis not present

## 2023-08-15 DIAGNOSIS — I429 Cardiomyopathy, unspecified: Secondary | ICD-10-CM | POA: Diagnosis not present

## 2023-08-15 DIAGNOSIS — I509 Heart failure, unspecified: Secondary | ICD-10-CM | POA: Diagnosis not present

## 2023-08-15 DIAGNOSIS — J069 Acute upper respiratory infection, unspecified: Secondary | ICD-10-CM | POA: Diagnosis not present

## 2023-08-15 DIAGNOSIS — B9689 Other specified bacterial agents as the cause of diseases classified elsewhere: Secondary | ICD-10-CM | POA: Diagnosis not present

## 2023-08-16 DIAGNOSIS — D3132 Benign neoplasm of left choroid: Secondary | ICD-10-CM | POA: Diagnosis not present

## 2023-08-28 DIAGNOSIS — R5383 Other fatigue: Secondary | ICD-10-CM | POA: Diagnosis not present

## 2023-08-28 DIAGNOSIS — B9689 Other specified bacterial agents as the cause of diseases classified elsewhere: Secondary | ICD-10-CM | POA: Diagnosis not present

## 2023-08-28 DIAGNOSIS — J069 Acute upper respiratory infection, unspecified: Secondary | ICD-10-CM | POA: Diagnosis not present

## 2023-08-28 DIAGNOSIS — F411 Generalized anxiety disorder: Secondary | ICD-10-CM | POA: Diagnosis not present

## 2023-08-28 DIAGNOSIS — J189 Pneumonia, unspecified organism: Secondary | ICD-10-CM | POA: Diagnosis not present

## 2023-08-28 DIAGNOSIS — J329 Chronic sinusitis, unspecified: Secondary | ICD-10-CM | POA: Diagnosis not present

## 2023-08-28 DIAGNOSIS — Z23 Encounter for immunization: Secondary | ICD-10-CM | POA: Diagnosis not present

## 2023-09-13 DIAGNOSIS — J40 Bronchitis, not specified as acute or chronic: Secondary | ICD-10-CM | POA: Diagnosis not present

## 2023-09-25 DIAGNOSIS — G4700 Insomnia, unspecified: Secondary | ICD-10-CM | POA: Diagnosis not present

## 2023-09-25 DIAGNOSIS — M179 Osteoarthritis of knee, unspecified: Secondary | ICD-10-CM | POA: Diagnosis not present

## 2023-09-25 DIAGNOSIS — M810 Age-related osteoporosis without current pathological fracture: Secondary | ICD-10-CM | POA: Diagnosis not present

## 2023-09-25 DIAGNOSIS — J449 Chronic obstructive pulmonary disease, unspecified: Secondary | ICD-10-CM | POA: Diagnosis not present

## 2023-09-25 DIAGNOSIS — F411 Generalized anxiety disorder: Secondary | ICD-10-CM | POA: Diagnosis not present

## 2023-09-25 DIAGNOSIS — F331 Major depressive disorder, recurrent, moderate: Secondary | ICD-10-CM | POA: Diagnosis not present

## 2023-10-13 DIAGNOSIS — J40 Bronchitis, not specified as acute or chronic: Secondary | ICD-10-CM | POA: Diagnosis not present

## 2023-10-21 DIAGNOSIS — M170 Bilateral primary osteoarthritis of knee: Secondary | ICD-10-CM | POA: Diagnosis not present

## 2023-11-01 DIAGNOSIS — M25531 Pain in right wrist: Secondary | ICD-10-CM | POA: Diagnosis not present

## 2023-11-01 DIAGNOSIS — M1711 Unilateral primary osteoarthritis, right knee: Secondary | ICD-10-CM | POA: Diagnosis not present

## 2023-11-13 DIAGNOSIS — J40 Bronchitis, not specified as acute or chronic: Secondary | ICD-10-CM | POA: Diagnosis not present

## 2023-12-03 DIAGNOSIS — R739 Hyperglycemia, unspecified: Secondary | ICD-10-CM | POA: Diagnosis not present

## 2023-12-03 DIAGNOSIS — E559 Vitamin D deficiency, unspecified: Secondary | ICD-10-CM | POA: Diagnosis not present

## 2023-12-03 DIAGNOSIS — R5383 Other fatigue: Secondary | ICD-10-CM | POA: Diagnosis not present

## 2023-12-03 DIAGNOSIS — E785 Hyperlipidemia, unspecified: Secondary | ICD-10-CM | POA: Diagnosis not present

## 2023-12-04 ENCOUNTER — Encounter: Payer: Self-pay | Admitting: Cardiovascular Disease

## 2023-12-04 ENCOUNTER — Ambulatory Visit: Payer: Medicare PPO | Attending: Cardiovascular Disease | Admitting: Cardiovascular Disease

## 2023-12-04 VITALS — BP 120/74 | HR 81 | Ht 61.0 in | Wt 183.0 lb

## 2023-12-04 DIAGNOSIS — R29898 Other symptoms and signs involving the musculoskeletal system: Secondary | ICD-10-CM | POA: Diagnosis not present

## 2023-12-04 DIAGNOSIS — M79604 Pain in right leg: Secondary | ICD-10-CM

## 2023-12-04 DIAGNOSIS — R296 Repeated falls: Secondary | ICD-10-CM

## 2023-12-04 DIAGNOSIS — I10 Essential (primary) hypertension: Secondary | ICD-10-CM

## 2023-12-04 DIAGNOSIS — E7800 Pure hypercholesterolemia, unspecified: Secondary | ICD-10-CM

## 2023-12-04 DIAGNOSIS — R0602 Shortness of breath: Secondary | ICD-10-CM

## 2023-12-04 DIAGNOSIS — M79605 Pain in left leg: Secondary | ICD-10-CM | POA: Diagnosis not present

## 2023-12-04 MED ORDER — FUROSEMIDE 40 MG PO TABS: 40.0000 mg | ORAL_TABLET | Freq: Every day | ORAL | 3 refills | Status: DC

## 2023-12-04 MED ORDER — POTASSIUM CHLORIDE CRYS ER 20 MEQ PO TBCR: 20.0000 meq | EXTENDED_RELEASE_TABLET | Freq: Every day | ORAL | 3 refills | Status: DC

## 2023-12-04 NOTE — Patient Instructions (Signed)
Medication Instructions:  STOP ROSUVASTATIN START FUROSEMIDE 40 MG DAILY START POTASSIUM CHLORIDE 20 MEQ DAILY *If you need a refill on your cardiac medications before your next appointment, please call your pharmacy*  Testing/Procedures: Your physician has requested that you have an echocardiogram. Echocardiography is a painless test that uses sound waves to create images of your heart. It provides your doctor with information about the size and shape of your heart and how well your heart's chambers and valves are working. This procedure takes approximately one hour. There are no restrictions for this procedure. Please do NOT wear cologne, perfume, aftershave, or lotions (deodorant is allowed). Please arrive 15 minutes prior to your appointment time.  Please note: We ask at that you not bring children with you during ultrasound (echo/ vascular) testing. Due to room size and safety concerns, children are not allowed in the ultrasound rooms during exams. Our front office staff cannot provide observation of children in our lobby area while testing is being conducted. An adult accompanying a patient to their appointment will only be allowed in the ultrasound room at the discretion of the ultrasound technician under special circumstances. We apologize for any inconvenience.   Vascuscreen: includes 3 screenings: Carotid duplex, abdominal aorta duplex, and ankle brachial index  Self pay: $125  Your physician has requested that you have a carotid duplex. This test is an ultrasound of the carotid arteries in your neck. It looks at blood flow through these arteries that supply the brain with blood. Allow one hour for this exam. There are no restrictions or special instructions.   Your physician has requested that you have an abdominal aorta duplex. During this test, an ultrasound is used to evaluate the aorta. Allow 30 minutes for this exam. Do not eat after midnight the day before and avoid carbonated  beverages   Your physician has requested that you have an ankle brachial index (ABI). During this test an ultrasound and blood pressure cuff are used to evaluate the arteries that supply the arms and legs with blood. Allow thirty minutes for this exam. There are no restrictions or special instructions.     Follow-Up: At [facility], you and your health needs are our priority.  As part of our continuing mission to provide you with exceptional heart care, we have created designated Provider Care Teams.  These Care Teams include your primary Cardiologist (physician) and Advanced Practice Providers (APPs -  Physician Assistants and Nurse Practitioners) who all work together to provide you with the care you need, when you need it.  We recommend signing up for the patient portal called "MyChart".  Sign up information is provided on this After Visit Summary.  MyChart is used to connect with patients for Virtual Visits (Telemedicine).  Patients are able to view lab/test results, encounter notes, upcoming appointments, etc.  Non-urgent messages can be sent to your provider as well.   To learn more about what you can do with MyChart, go to [email or web address].    Your next appointment:   APP- 4-6 WEEKS  Dr Royann Shivers 3-4 months

## 2023-12-04 NOTE — Progress Notes (Signed)
Cardiology Office Note:    Date:  12/07/2023   ID:  Carolyn Yang, DOB May 23, 1941, MRN [id number]  PCP:  Lewis Moccasin, MD   [facility] Providers Cardiologist:  None     Referring MD: Lewis Moccasin, MD   Chief Complaint  Patient presents with   Consult  Carolyn Yang is a 82 y.o. female who is being seen today for the evaluation of dyspnea at the request of Lewis Moccasin, MD.   History of Present Illness:    Carolyn Yang is a 82 y.o. female with  a history of hypertension and hyperlipidemia, presents with a chief complaint of recurrent falls and progressive dyspnea on exertion. The patient reports falling four times, both indoors and outdoors, with no loss of consciousness during these episodes. The falls are described as sudden, with the patient ending up on her back, and are associated with a sensation of the head going blank. No significant injuries have been reported from these falls, although the patient suspects she may have hit her head during some of the indoor falls.  In addition to the falls, the patient has been experiencing progressive dyspnea on exertion. Activities such as dressing, showering, and cooking reportedly leave the patient feeling exhausted and panting. The dyspnea does not occur when the patient is at rest or lying flat, and there is no associated chest discomfort. The patient also reports a change in voice, describing it as hoarse and weaker than usual, although there is no associated sore throat.  The patient has noticed possible leg swelling, as her support hose have started feeling tight around the knees. She has been wearing support hose for approximately three to four years due to varicose veins. The patient also reports a sensation of heat in her legs upon waking in the morning.  The patient's medical history includes hypertension, managed with valsartan, and hyperlipidemia, managed with rosuvastatin. The patient also takes propranolol  for a past history of palpitations and migraine headaches. The patient denies any current palpitations. The patient has never smoked and recently recovered from a chest infection, for which she used an inhaler as needed.  The patient reports a decrease in appetite and a general feeling of weakness, particularly in the mornings. The weakness reportedly improves by the afternoon but worsens again in the evening. The patient also reports a decrease in muscle strength, particularly in the legs, and has previously undergone physical therapy with significant improvement in strength and mobility. The patient has been hesitant to undergo knee replacement surgery, despite both knees qualifying for the procedure due to severe osteoarthritis.  The patient's spouse passed away recently, and the patient has been dealing with the associated grief and anxiety. The patient lives with her daughter, who assists with daily activities and has been advocating for the patient to return to physical therapy.  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  She required surgery, prolonged antibiotic therapy and anticoagulation with warfarin.  Current Medications: Current Meds  Medication Sig   diclofenac Sodium (VOLTAREN) 1 % GEL Apply 4 g topically 4 (four) times daily.   fluticasone (FLONASE) 50 MCG/ACT nasal spray Place 1 spray into both nostrils daily.    furosemide (LASIX) 40 MG tablet Take 1 tablet (40 mg total) by mouth daily.   Multiple Vitamins-Minerals (CENTRUM SILVER 50+WOMEN) TABS Take 1 tablet by mouth daily at 6 (six) AM.   potassium chloride SA (KLOR-CON M) 20 MEQ tablet Take 1 tablet (20 mEq total) by mouth daily.   propranolol (INDERAL) 20 MG tablet Take 20 mg by mouth 2 (two) times daily.   sertraline (ZOLOFT) 100 MG tablet Take 150 mg by mouth every morning.   valsartan (DIOVAN) 40 MG tablet Take 40 mg by mouth daily.   [DISCONTINUED] rosuvastatin (CRESTOR) 5 MG tablet Take 5 mg by mouth at bedtime.     Allergies:   Amoxicillin-pot clavulanate, Shrimp extract, and Codeine   Social History   Socioeconomic History   Marital status: Married    Spouse name: Not on file   Number of children: Not on file   Years of education: Not on file   Highest education level: Not on file  Occupational History   Not on file  Tobacco Use   Smoking status: Never   Smokeless tobacco: Never  Substance and Sexual Activity   Alcohol use: Not on file   Drug use: Not on file   Sexual activity: Not on file  Other Topics Concern   Not on file  Social History Narrative   Not on file   Social Drivers of Health   Financial Resource Strain: Not on file  Food Insecurity: Not on file  Transportation Needs: Not on file  Physical Activity: Not on file  Stress: Not on file  Social Connections: Not on file     Family History: The patient's family history includes Heart failure in her mother; Stroke in her brother and father.  ROS:   Please see the history of present illness.     All other systems reviewed and are negative.  EKGs/Labs/Other Studies Reviewed:    The following studies were reviewed today:  Normal TEE, EF 65 to 70%, no vegetations during streptococcal bacteremia 2018  EKG Interpretation Date/Time:  Wednesday December 04 2023 13:59:11 EST Ventricular Rate:  81 PR Interval:  128 QRS Duration:  72 QT Interval:  364 QTC  Calculation: 422 R Axis:   -44  Text Interpretation: Normal sinus rhythm Left axis deviation Left anterior fasicular block No previous ECGs available Confirmed by Kanen Mottola [phone]) on 12/04/2023 2:12:14 PM    Recent Labs: No results found for requested labs within last 365 days.  Recent Lipid Panel No results found for: "CHOL", "TRIG", "HDL", "CHOLHDL", "VLDL", "LDLCALC", "LDLDIRECT"   Risk Assessment/Calculations:        Physical Exam:    VS:  BP 120/74 (BP Location: Left Arm, Patient Position: Sitting, Cuff Size: Normal)   Pulse 81   Ht 5\' 1"  (1.549 m)   Wt 183 lb (83 kg)   SpO2 94%   BMI 34.58 kg/m     Wt Readings from Last 3 Encounters:  12/04/23 183 lb (83 kg)     GEN: Obese, Well nourished, well developed in no acute distress HEENT: Normal NECK:8 cm JVD; No carotid bruits LYMPHATICS: No lymphadenopathy CARDIAC: RRR, no murmurs, rubs, gallops RESPIRATORY:  Clear to auscultation without rales,  wheezing or rhonchi  ABDOMEN: Soft, non-tender, non-distended MUSCULOSKELETAL: Bilateral symmetrical 1-2+ ankle edema; No deformity  SKIN: Warm and dry NEUROLOGIC:  Alert and oriented x 3 PSYCHIATRIC:  Normal affect   ASSESSMENT:    1. SOB (shortness of breath)   2. Pain in both lower extremities   3. Falls frequently   4. Essential hypertension   5. Hypercholesteremia   6. 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  Her cardiac exam is otherwise normal.  She does not have orthopnea or PND.  Plan echocardiogram.  Start empirical treatment furosemide and potassium supplement.  Continue valsartan and propranolol which have been chronic medications and which may be beneficial if we identify depressed LV function.  The echocardiogram shows findings suggestive of  coronary artery disease  - Order echocardiogram - Prescribe furosemide (Lasix) - Prescribe potassium supplement - Schedule vascular screening for carotid artery disease, AAA, and peripheral artery disease  2.  Recurrent Falls Four recent falls without loss of consciousness, occurring indoors and outdoors, often when standing. No significant injuries except elbow injury. Likely due to weak leg and core muscles. Previous physical therapy improved strength and reduced falls. - Resume physical therapy - Advise caution and use of assistive devices if necessary  3.  Hypertension Well-managed with valsartan. - Continue valsartan  4.  Hyperlipidemia Managed with rosuvastatin. Possible statin-induced muscle weakness. Discussed statin holiday to assess symptom improvement. Alternative lipid-lowering medications available if needed. - Discontinue rosuvastatin for one month - Consider alternative lipid-lowering medications if muscle symptoms improve    5.  Muscle weakness Significant difficulty getting in and out of chair could simply be due to deconditioning, but raises concern for statin myopathy.  She also has persistent hoarseness and weak voice, worse in the evening.  This brings up the possibility that the differential includes myasthenia gravis.  Will try a statin holiday.  Strongly encourage starting physical therapy again.  Consider referral to neurologist if symptoms persist or worsen          Medication Adjustments/Labs and Tests Ordered: Current medicines are reviewed at length with the patient today.  Concerns regarding medicines are outlined above.  Orders Placed This Encounter  Procedures   EKG 12-Lead   ECHOCARDIOGRAM COMPLETE   VAS US VASCUSCREEN   Meds ordered this encounter  Medications   furosemide (LASIX) 40 MG tablet    Sig: Take 1 tablet (40 mg total) by mouth daily.    Dispense:  90 tablet    Refill:  3   potassium chloride SA (KLOR-CON M) 20 MEQ tablet     Sig: Take 1 tablet (20 mEq total) by mouth daily.    Dispense:  90 tablet    Refill:  3    Patient Instructions  Medication Instructions:  STOP ROSUVASTATIN START FUROSEMIDE 40 MG DAILY START POTASSIUM CHLORIDE 20 MEQ DAILY *If you need a refill on your cardiac medications before your next appointment, please call your pharmacy*  Testing/Procedures: Your physician has requested that you have an echocardiogram. Echocardiography is a painless test that uses sound waves to create images of your heart. It provides your doctor with information about the size and shape of your heart and how well your heart's chambers and valves are working. This procedure takes approximately one hour. There are no restrictions for this  procedure. Please do NOT wear cologne, perfume, aftershave, or lotions (deodorant is allowed). Please arrive 15 minutes prior to your appointment time.  Please note: We ask at that you not bring children with you during ultrasound (echo/ vascular) testing. Due to room size and safety concerns, children are not allowed in the ultrasound rooms during exams. Our front office staff cannot provide observation of children in our lobby area while testing is being conducted. An adult accompanying a patient to their appointment will only be allowed in the ultrasound room at the discretion of the ultrasound technician under special circumstances. We apologize for any inconvenience.   Vascuscreen: includes 3 screenings: Carotid duplex, abdominal aorta duplex, and ankle brachial index  Self pay: $125  Your physician has requested that you have a carotid duplex. This test is an ultrasound of the carotid arteries in your neck. It looks at blood flow through these arteries that supply the brain with blood. Allow one hour for this exam. There are no restrictions or special instructions.   Your physician has requested that you have an abdominal aorta duplex. During this test, an ultrasound is used  to evaluate the aorta. Allow 30 minutes for this exam. Do not eat after midnight the day before and avoid carbonated beverages   Your physician has requested that you have an ankle brachial index (ABI). During this test an ultrasound and blood pressure cuff are used to evaluate the arteries that supply the arms and legs with blood. Allow thirty minutes for this exam. There are no restrictions or special instructions.     Follow-Up: At [facility], you and your health needs are our priority.  As part of our continuing mission to provide you with exceptional heart care, we have created designated Provider Care Teams.  These Care Teams include your primary Cardiologist (physician) and Advanced Practice Providers (APPs -  Physician Assistants and Nurse Practitioners) who all work together to provide you with the care you need, when you need it.  We recommend signing up for the patient portal called "MyChart".  Sign up information is provided on this After Visit Summary.  MyChart is used to connect with patients for Virtual Visits (Telemedicine).  Patients are able to view lab/test results, encounter notes, upcoming appointments, etc.  Non-urgent messages can be sent to your provider as well.   To learn more about what you can do with MyChart, go to [email or web address].    Your next appointment:   APP- 4-6 WEEKS  Dr Royann Shivers 3-4 months   Signed, Thurmon Fair, MD  12/07/2023 8:05 PM    [facility]

## 2023-12-05 DIAGNOSIS — R0609 Other forms of dyspnea: Secondary | ICD-10-CM | POA: Diagnosis not present

## 2023-12-05 DIAGNOSIS — Z789 Other specified health status: Secondary | ICD-10-CM | POA: Diagnosis not present

## 2023-12-05 DIAGNOSIS — R296 Repeated falls: Secondary | ICD-10-CM | POA: Diagnosis not present

## 2023-12-05 DIAGNOSIS — J449 Chronic obstructive pulmonary disease, unspecified: Secondary | ICD-10-CM | POA: Diagnosis not present

## 2023-12-05 DIAGNOSIS — E559 Vitamin D deficiency, unspecified: Secondary | ICD-10-CM | POA: Diagnosis not present

## 2023-12-05 DIAGNOSIS — E782 Mixed hyperlipidemia: Secondary | ICD-10-CM | POA: Diagnosis not present

## 2023-12-05 DIAGNOSIS — I10 Essential (primary) hypertension: Secondary | ICD-10-CM | POA: Diagnosis not present

## 2023-12-05 DIAGNOSIS — Z79899 Other long term (current) drug therapy: Secondary | ICD-10-CM | POA: Diagnosis not present

## 2023-12-07 ENCOUNTER — Encounter: Payer: Self-pay | Admitting: Cardiovascular Disease

## 2023-12-10 DIAGNOSIS — M25562 Pain in left knee: Secondary | ICD-10-CM | POA: Diagnosis not present

## 2023-12-10 DIAGNOSIS — M25561 Pain in right knee: Secondary | ICD-10-CM | POA: Diagnosis not present

## 2023-12-11 DIAGNOSIS — N952 Postmenopausal atrophic vaginitis: Secondary | ICD-10-CM | POA: Diagnosis not present

## 2023-12-11 DIAGNOSIS — M810 Age-related osteoporosis without current pathological fracture: Secondary | ICD-10-CM | POA: Diagnosis not present

## 2023-12-11 DIAGNOSIS — R0602 Shortness of breath: Secondary | ICD-10-CM | POA: Diagnosis not present

## 2023-12-11 DIAGNOSIS — M170 Bilateral primary osteoarthritis of knee: Secondary | ICD-10-CM | POA: Diagnosis not present

## 2023-12-11 DIAGNOSIS — J449 Chronic obstructive pulmonary disease, unspecified: Secondary | ICD-10-CM | POA: Diagnosis not present

## 2023-12-11 DIAGNOSIS — I10 Essential (primary) hypertension: Secondary | ICD-10-CM | POA: Diagnosis not present

## 2023-12-13 DIAGNOSIS — J40 Bronchitis, not specified as acute or chronic: Secondary | ICD-10-CM | POA: Diagnosis not present

## 2023-12-16 ENCOUNTER — Ambulatory Visit: Payer: Medicare PPO | Admitting: Cardiovascular Disease

## 2023-12-19 DIAGNOSIS — J441 Chronic obstructive pulmonary disease with (acute) exacerbation: Secondary | ICD-10-CM | POA: Diagnosis not present

## 2023-12-20 DIAGNOSIS — M25571 Pain in right ankle and joints of right foot: Secondary | ICD-10-CM | POA: Diagnosis not present

## 2023-12-26 DIAGNOSIS — F411 Generalized anxiety disorder: Secondary | ICD-10-CM | POA: Diagnosis not present

## 2023-12-26 DIAGNOSIS — I10 Essential (primary) hypertension: Secondary | ICD-10-CM | POA: Diagnosis not present

## 2023-12-26 DIAGNOSIS — J441 Chronic obstructive pulmonary disease with (acute) exacerbation: Secondary | ICD-10-CM | POA: Diagnosis not present

## 2023-12-26 DIAGNOSIS — G4700 Insomnia, unspecified: Secondary | ICD-10-CM | POA: Diagnosis not present

## 2023-12-26 DIAGNOSIS — F331 Major depressive disorder, recurrent, moderate: Secondary | ICD-10-CM | POA: Diagnosis not present

## 2024-01-01 ENCOUNTER — Encounter: Payer: Self-pay | Admitting: Sports Medicine

## 2024-01-01 ENCOUNTER — Ambulatory Visit: Payer: Medicare PPO | Admitting: Sports Medicine

## 2024-01-01 VITALS — BP 124/70 | HR 76 | Temp 97.6°F | Resp 18 | Ht 61.0 in | Wt 177.6 lb

## 2024-01-01 DIAGNOSIS — M159 Polyosteoarthritis, unspecified: Secondary | ICD-10-CM | POA: Diagnosis not present

## 2024-01-01 DIAGNOSIS — C44519 Basal cell carcinoma of skin of other part of trunk: Secondary | ICD-10-CM | POA: Insufficient documentation

## 2024-01-01 DIAGNOSIS — G43809 Other migraine, not intractable, without status migrainosus: Secondary | ICD-10-CM | POA: Diagnosis not present

## 2024-01-01 DIAGNOSIS — R0602 Shortness of breath: Secondary | ICD-10-CM | POA: Diagnosis not present

## 2024-01-01 DIAGNOSIS — R0982 Postnasal drip: Secondary | ICD-10-CM | POA: Diagnosis not present

## 2024-01-01 DIAGNOSIS — F321 Major depressive disorder, single episode, moderate: Secondary | ICD-10-CM | POA: Diagnosis not present

## 2024-01-01 MED ORDER — FLUTICASONE PROPIONATE 50 MCG/ACT NA SUSP: 2.0000 | Freq: Every day | NASAL | 6 refills | Status: DC

## 2024-01-01 MED ORDER — PROPRANOLOL HCL 20 MG PO TABS: 20.0000 mg | ORAL_TABLET | Freq: Every day | ORAL | Status: DC

## 2024-01-01 NOTE — Progress Notes (Signed)
 Careteam: Patient Care Team: Sherlynn Madden, MD as PCP - General (Internal Medicine)  PLACE OF SERVICE:  [facility]  Advanced Directive information Does Patient Have a Medical Advance Directive?: Yes, Type of Advance Directive: Healthcare Power of [place];Living will, Does patient want to make changes to medical advance directive?: No - Patient declined  Allergies  Allergen Reactions   Amoxicillin-Pot Clavulanate Itching and Rash   Shrimp Extract Nausea And Vomiting   Codeine Nausea And Vomiting and Nausea Only    Chief Complaint  Patient presents with   Establish Care    New patient.      Discussed the use of AI scribe software for clinical note transcription with the patient, who gave verbal consent to proceed.  History of Present Illness   The patient, a widowed individual living with her son, presented with a history of multiple falls within the past month, one of which occurred in the bathroom without any preceding warning signs. The patient denied any associated dizziness or lightheadedness prior to the falls. The patient reported a feeling of imbalance and wobbliness, even when holding onto support, and described difficulty standing for extended periods due to knee pain.  The patient's knees were diagnosed as being bone on bone by an orthopedic doctor, and the patient had previously undergone physical therapy for about six weeks. The patient's daughter reported a cycle of the patient feeling better after physical therapy but then not wanting to continue due to feeling unwell.  The patient also reported shortness of breath, which had been ongoing for several months, and was particularly noticeable when talking. The patient had been prescribed trelligy, but the patient did not notice any significant improvement in her breathing. The patient also reported a constant nasal drip, particularly when lying down, and a dry mouth upon waking up.  The patient had been taking  propranolol  for migraine headaches since the late seventies, but reported no recent migraine flare-ups. The patient was also on sertraline  for depression and anxiety for the past three to four years. The patient denied any loss of interest or sleep disturbances, but reported feeling tired and having no energy all the time. The patient's son reported that the patient had been diagnosed with osteoporosis and had been prescribed Fosamax  (alendronate ), but the medication had been stopped. The patient had also been prescribed a fluid pill and potassium pill by a cardiologist, but these had also been stopped.  The patient reported occasional use of ibuprofen for pain, but was advised to switch to Tylenol   . The patient also reported a history of varicose veins and was using compression stockings. The patient denied any heartburn, abdominal pain, or blood in urine or stool, but reported frequent bouts of diarrhea. The patient's son reported that the patient's shortness of breath had started after the death of her spouse four years ago, suggesting a possible link to grief and anxiety.       Review of Systems:  Review of Systems  Constitutional:  Negative for chills and fever.  HENT:  Negative for congestion and sore throat.   Eyes:  Negative for double vision.  Respiratory:  Positive for cough and shortness of breath. Negative for sputum production.   Cardiovascular:  Negative for chest pain, palpitations and leg swelling.  Gastrointestinal:  Negative for abdominal pain, heartburn and nausea.  Genitourinary:  Negative for dysuria, frequency and hematuria.  Musculoskeletal:  Negative for falls and myalgias.  Neurological:  Negative for dizziness, sensory change and focal weakness.  Negative unless indicated in HPI.   Past Medical History:  Diagnosis Date   Hyperlipidemia    Hypertension    Migraine    Osteoarthritis of both knees    Past Surgical History:  Procedure Laterality Date   ABSCESS  DRAINAGE  2018   Intracranial empyema/masticator space abscess   BIOPSY BREAST     CESAREAN SECTION     CHOLECYSTECTOMY     LEFT OOPHORECTOMY     Social History:   reports that she has never smoked. She has never used smokeless tobacco. No history on file for alcohol  use and drug use.  Family History  Problem Relation Age of Onset   Heart failure Mother    Stroke Father    Stroke Brother     Medications: Patient's Medications  New Prescriptions   No medications on file  Previous Medications   ALBUTEROL (PROVENTIL) (2.5 MG/3ML) 0.083% NEBULIZER SOLUTION    Take 2.5 mg by nebulization every 4 (four) hours as needed.   ALENDRONATE  (FOSAMAX ) 70 MG TABLET    Take 70 mg by mouth once a week.   DICLOFENAC SODIUM (VOLTAREN) 1 % GEL    Apply 4 g topically 4 (four) times daily.   ESTRADIOL (ESTRACE) 0.1 MG/GM VAGINAL CREAM    Place 1 Applicatorful vaginally daily. APPLY DIME SIZE DAILY TO VAGINAL OPENING   FLUTICASONE  (FLONASE ) 50 MCG/ACT NASAL SPRAY    Place 1 spray into both nostrils daily.   FUROSEMIDE  (LASIX ) 40 MG TABLET    Take 1 tablet (40 mg total) by mouth daily.   MULTIPLE VITAMINS-MINERALS (CENTRUM SILVER 50+WOMEN) TABS    Take 1 tablet by mouth daily at 6 (six) AM.   POTASSIUM CHLORIDE  SA (KLOR-CON  M) 20 MEQ TABLET    Take 1 tablet (20 mEq total) by mouth daily.   PROPRANOLOL  (INDERAL ) 20 MG TABLET    Take 20 mg by mouth 2 (two) times daily.   SERTRALINE  (ZOLOFT ) 100 MG TABLET    Take 150 mg by mouth every morning.   TRELEGY ELLIPTA 200-62.5-25 MCG/ACT AEPB    Take 1 puff by mouth daily.   VALSARTAN (DIOVAN) 40 MG TABLET    Take 40 mg by mouth daily.  Modified Medications   No medications on file  Discontinued Medications   No medications on file    Physical Exam: Vitals:   01/01/24 1438  BP: 124/70  Pulse: 76  Resp: 18  Temp: 97.6 F (36.4 C)  SpO2: 98%  Weight: 177 lb 9.6 oz (80.6 kg)  Height: 5' 1 (1.549 m)   Body mass index is 33.56 kg/m. BP Readings from  Last 3 Encounters:  01/01/24 124/70  12/04/23 120/74   Wt Readings from Last 3 Encounters:  01/01/24 177 lb 9.6 oz (80.6 kg)  12/04/23 183 lb (83 kg)    Physical Exam Constitutional:      Appearance: Normal appearance.  HENT:     Head: Normocephalic and atraumatic.  Cardiovascular:     Rate and Rhythm: Normal rate and regular rhythm.  Pulmonary:     Effort: Pulmonary effort is normal. No respiratory distress.     Breath sounds: Normal breath sounds. No wheezing.  Abdominal:     General: Bowel sounds are normal. There is no distension.     Tenderness: There is no abdominal tenderness. There is no guarding or rebound.     Comments:    Musculoskeletal:        General: No swelling or tenderness.  Skin:  General: Skin is dry.  Neurological:     Mental Status: She is alert. Mental status is at baseline.     Sensory: No sensory deficit.     Motor: No weakness.     Labs reviewed: Basic Metabolic Panel: No results for input(s): NA, K, CL, CO2, GLUCOSE, BUN, CREATININE, CALCIUM , MG, PHOS, TSH in the last 8760 hours. Liver Function Tests: No results for input(s): AST, ALT, ALKPHOS, BILITOT, PROT, ALBUMIN in the last 8760 hours. No results for input(s): LIPASE, AMYLASE in the last 8760 hours. No results for input(s): AMMONIA in the last 8760 hours. CBC: No results for input(s): WBC, NEUTROABS, HGB, HCT, MCV, PLT in the last 8760 hours. Lipid Panel: No results for input(s): CHOL, HDL, LDLCALC, TRIG, CHOLHDL, LDLDIRECT in the last 8760 hours. TSH: No results for input(s): TSH in the last 8760 hours. A1C: No results found for: HGBA1C   Assessment/Plan Frequent Falls Recent falls within the month, no pre-syncopal symptoms reported. Likely due to muscle weakness from severe arthritis and deconditioning. -Start physical therapy to improve strength and balance. -Continue use of walker for  mobility.  Osteoporosis History of osteoporosis and recent falls. -Order bone density test to assess current bone strength. will get records from previous PCP  Migraine History of migraines, currently managed with Propranolol  20mg  BID. No recent flare-ups reported. -Reduce Propranolol  to 20mg  daily due to potential side effects contributing to falls.  Depression Managed with Sertraline  1.5 tablets daily. -Continue current regimen.  Arthritis Severe arthritis in knees, cortisone and gel shots no longer effective. -Recommend Arthritis Strength Tylenol  650mg  up to 4 tablets daily for pain management.  Shortness of Breath Recent onset, no clear etiology identified. -Order lung function test to assess for potential pulmonary causes. -Resume Furosemide  and Potassium Chloride  to manage potential fluid buildup. -Follow up with cardiologist for further evaluation.  Postnasal Drip Constant nasal drip reported, potentially contributing to cough and hoarseness. -Recommend daily use of Flonase  and Zyrtec.  Hypertension History of hypertension, currently not on medication. Blood pressure controlled at 124/70. -No changes to current management.  Follow-up in 1 month to reassess and adjust treatment plan as necessary.  No follow-ups on file.:    60 minTotal time spent for obtaining history,  performing a medically appropriate examination and evaluation, reviewing the tests,  documenting clinical information in the electronic or other health record, independently interpreting results ,care coordination (not separately reported)

## 2024-01-01 NOTE — Patient Instructions (Signed)
 FREQUENT FALLS: Your recent falls are likely due to muscle weakness from severe arthritis and deconditioning. We will start physical therapy to help improve your strength and balance. Please continue using your walker for mobility.  -OSTEOPOROSIS: Osteoporosis is a condition where bones become weak and brittle. We will order a bone density test to assess your current bone strength.  -MIGRAINE: Migraines are severe headaches often accompanied by other symptoms. Since you haven't had any recent flare-ups, we will reduce your Propranolol  to 20mg  daily to minimize potential side effects that could contribute to falls.  -DEPRESSION: Depression is a mood disorder that causes persistent feelings of sadness and loss of interest. We will continue your current regimen of Sertraline  at 1.5 tablets daily.  -ARTHRITIS: Arthritis is inflammation of the joints, causing pain and stiffness. For your knee pain, we recommend taking Arthritis Strength Tylenol  650mg , up to 4 tablets daily.  -SHORTNESS OF BREATH: Shortness of breath can have many causes. We refer to pulmonologist  to check for any lung issues and resume your Furosemide  and Potassium Chloride  to manage potential fluid buildup. Please follow up with your cardiologist for further evaluation.  -POSTNASAL DRIP: Postnasal drip is when excess mucus from the nose drips down the back of the throat, which can cause coughing and hoarseness. We recommend using Flonase  and Zyrtec daily to help manage this.  -HYPERTENSION: Hypertension is high blood pressure. Your blood pressure is currently well-controlled at 124/70, so no changes to your management are needed.  INSTRUCTIONS:  Please follow up in 1 month to reassess and adjust your treatment plan as necessary. We will also conduct a bone density test and a lung function test. Additionally, please follow up with your cardiologist for further evaluation of your shortness of breath.

## 2024-01-03 ENCOUNTER — Ambulatory Visit (HOSPITAL_COMMUNITY)
Admission: RE | Admit: 2024-01-03 | Discharge: 2024-01-03 | Disposition: A | Discharge status: Home / Self Care | Payer: Medicare PPO | Source: Ambulatory Visit | Attending: Cardiovascular Disease | Admitting: Cardiovascular Disease

## 2024-01-03 DIAGNOSIS — M79605 Pain in left leg: Secondary | ICD-10-CM

## 2024-01-06 ENCOUNTER — Ambulatory Visit (HOSPITAL_COMMUNITY)
Admission: RE | Admit: 2024-01-06 | Discharge: 2024-01-06 | Disposition: A | Discharge status: Home / Self Care | Payer: Medicare PPO | Source: Ambulatory Visit | Attending: Cardiovascular Disease | Admitting: Cardiovascular Disease

## 2024-01-06 DIAGNOSIS — R0602 Shortness of breath: Secondary | ICD-10-CM | POA: Insufficient documentation

## 2024-01-06 LAB — ECHOCARDIOGRAM COMPLETE
AR max vel: 1.78 cm2
AV Area VTI: 1.83 cm2
AV Area mean vel: 1.77 cm2
AV Mean grad: 3 mm[Hg]
AV Peak grad: 6 mm[Hg]
AV Vena cont: 0.29 cm
Ao pk vel: 1.22 m/s
Area-P 1/2: 2.44 cm2
P 1/2 time: 671 ms
S' Lateral: 2.12 cm

## 2024-01-07 ENCOUNTER — Encounter: Payer: Self-pay | Admitting: Cardiovascular Disease

## 2024-01-09 ENCOUNTER — Telehealth: Payer: Self-pay

## 2024-01-09 MED ORDER — MIRTAZAPINE 15 MG PO TABS: 15.0000 mg | ORAL_TABLET | Freq: Every day | ORAL | 1 refills | Status: DC

## 2024-01-09 NOTE — Telephone Encounter (Signed)
Patient daughter Carolyn Yang is calling to see if her Sertraline could be increased. Her daughter states she thinks her depression is getting worse and the SOB is due to anxiety possibly.

## 2024-01-09 NOTE — Telephone Encounter (Signed)
Patient and her daughter were notified about the additional medication. They are going to head to the pharmacy to get medication

## 2024-01-13 DIAGNOSIS — J40 Bronchitis, not specified as acute or chronic: Secondary | ICD-10-CM | POA: Diagnosis not present

## 2024-01-13 DIAGNOSIS — M25561 Pain in right knee: Secondary | ICD-10-CM | POA: Diagnosis not present

## 2024-01-13 DIAGNOSIS — M25562 Pain in left knee: Secondary | ICD-10-CM | POA: Diagnosis not present

## 2024-01-13 NOTE — Progress Notes (Deleted)
Cardiology Clinic Note   Patient Name: Carolyn Yang Date of Encounter: 01/13/2024  Primary Care Provider:  Venita Sheffield, MD Primary Cardiologist:  None  Patient Profile    Carolyn Yang 83 year old female presents to the clinic today for follow-up evaluation of her shortness of breath.  Past Medical History    Past Medical History:  Diagnosis Date   Hyperlipidemia    Hypertension    Migraine    Osteoarthritis of both knees    Past Surgical History:  Procedure Laterality Date   ABSCESS DRAINAGE  2018   Intracranial empyema/masticator space abscess   BIOPSY BREAST     CESAREAN SECTION     CHOLECYSTECTOMY     DG GALL BLADDER  2000   LEFT OOPHORECTOMY     pelvis and verterbrae  2022   VAGINAL HYSTERECTOMY  1979    Allergies  Allergies  Allergen Reactions   Amoxicillin-Pot Clavulanate Itching and Rash   Shrimp Extract Nausea And Vomiting   Codeine Nausea And Vomiting and Nausea Only    History of Present Illness    Carolyn Yang has a PMH of HTN, HLD, progressive dyspnea on exertion, and recurrent falls.  She was seen for evaluation by Dr. Royann Shivers on 12/04/2023.  She reported recurrent falls and progressive dyspnea on exertion.  Echocardiogram was ordered and showed LVEF of 55-60% and G1 DD.  Vascular ultrasound was also ordered and showed no meaningful plaque.  She had described her falls as sudden.  She would end up on her back.  She did note a sensation of her head going blank.  She had no significant injuries.  She also noted increased work of breathing/DOE with activities like dressing, showering, and cooking.  She would feel exhausted.  She denied dyspnea at rest and with laying flat.  She was noted to have lower extremity swelling.  She reported that her lower extremity support stockings started feeling tight around her knees.  She had been wearing her lower extremity support stockings for approximately 3 to 4 years due to varicose veins.  She reported  decrease in her appetite and generalized weakness particularly in the mornings.  She reported that her spouse had passed away recently.  She has been dealing with grief and anxiety.  She was living with her daughter who is assisting her with her daily activities and was advocating for her to return to physical therapy.  It was felt that her lower extremity muscle weakness may be related to her rosuvastatin.  She was given a statin holiday.    She contacted the clinic via MyChart and reported that she continued to have difficulty with mobility and physical activities.  Dr. Royann Shivers reviewed messages and felt that her weakness was due to skill skeletal/joint problems rather than statin therapy.  He hoped to restart statin therapy eventually.  She presents to the clinic today for evaluation and states***.  Exertional dyspnea/DOE-breathing somewhat better.  Tolerating Lasix well.  Continues to note weakness in her lower extremities.  Echocardiogram reassuring.  Details above. Increase physical activity as tolerated-chair exercises given Continue current medical therapy Heart healthy low-sodium diet Continue lower extremity support stockings  Essential hypertension-BP today Maintain blood pressure log Continue valsartan  Hyperlipidemia-LDL***.  Given a statin holiday due to muscle weakness.  It does not appear that her muscle weakness is related to statin therapy. Resume rosuvastatin Repeat fasting lipids and LFTs in 6-8 weeks  Muscle weakness, falls-previously she was noted to have hoarseness/weak voice which was worse  in the evening and progressive lower extremity weakness.  It was felt that this could be related to myasthenia gravis.  Denies subsequent falls Chair exercises, refer to physical therapy Refer to neurology for further evaluation/prognostication  Disposition: Follow-up with Dr. Royann Shivers or me in 3 months.  Home Medications    Prior to Admission medications   Medication Sig  Start Date End Date Taking? Authorizing Provider  albuterol (PROVENTIL) (2.5 MG/3ML) 0.083% nebulizer solution Take 2.5 mg by nebulization every 4 (four) hours as needed. 08/13/23   [provider]  diclofenac Sodium (VOLTAREN) 1 % GEL Apply 4 g topically 4 (four) times daily. 09/25/23   [provider]  estradiol (ESTRACE) 0.1 MG/GM vaginal cream Place 1 Applicatorful vaginally daily. APPLY DIME SIZE DAILY TO VAGINAL OPENING    [provider]  fluticasone (FLONASE) 50 MCG/ACT nasal spray Place 2 sprays into both nostrils daily. 01/01/24   Venita Sheffield, MD  furosemide (LASIX) 40 MG tablet Take 1 tablet (40 mg total) by mouth daily. 12/04/23 03/03/24  Croitoru, Mihai, MD  mirtazapine (REMERON) 15 MG tablet Take 1 tablet (15 mg total) by mouth at bedtime. 01/09/24   Venita Sheffield, MD  Multiple Vitamins-Minerals (CENTRUM SILVER 50+WOMEN) TABS Take 1 tablet by mouth daily at 6 (six) AM.    [provider]  potassium chloride SA (KLOR-CON M) 20 MEQ tablet Take 1 tablet (20 mEq total) by mouth daily. 12/04/23   Croitoru, Mihai, MD  propranolol (INDERAL) 20 MG tablet Take 1 tablet (20 mg total) by mouth daily. 01/01/24   Venita Sheffield, MD  sertraline (ZOLOFT) 100 MG tablet Take 150 mg by mouth every morning. 11/23/23   [provider]    Family History    Family History  Problem Relation Age of Onset   Heart failure Mother    Arthritis-Osteo Mother    Stroke Father    Heart attack Father    Stroke Brother    She indicated that the status of her mother is unknown. She indicated that the status of her father is unknown. She indicated that the status of her brother is unknown.  Social History    Social History   Socioeconomic History   Marital status: Widowed    Spouse name: Not on file   Number of children: Not on file   Years of education: Not on file   Highest education level: Bachelor's degree (e.g., BA, AB, BS)  Occupational  History   Not on file  Tobacco Use   Smoking status: Never   Smokeless tobacco: Never  Vaping Use   Vaping status: Never Used  Substance and Sexual Activity   Alcohol use: Not Currently   Drug use: Not Currently   Sexual activity: Not Currently  Other Topics Concern   Not on file  Social History Narrative   Diet:      Caffeine:yes       Married, if yes what year:       Do you live in a house, apartment, assisted living, condo, trailer, ect: townhouse      Is it one or more stories: 1      How many persons live in your home?2      Pets:0      Highest level or education completed: BA       Current/Past profession:Teacher      Exercise:                  Type and how often:  Living Will:yes   DNR:   POA/HPOA:yes      Functional Status:   Do you have difficulty bathing or dressing yourself?no   Do you have difficulty preparing food or eating?no   Do you have difficulty managing your medications?no   Do you have difficulty managing your finances?no    Do you have difficulty affording your medications? no   Social Drivers of Corporate investment banker Strain: Not on file  Food Insecurity: Not on file  Transportation Needs: Not on file  Physical Activity: Not on file  Stress: Not on file  Social Connections: Not on file  Intimate Partner Violence: Not on file     Review of Systems    General:  No chills, fever, night sweats or weight changes.  Cardiovascular:  No chest pain, dyspnea on exertion, edema, orthopnea, palpitations, paroxysmal nocturnal dyspnea. Dermatological: No rash, lesions/masses Respiratory: No cough, dyspnea Urologic: No hematuria, dysuria Abdominal:   No nausea, vomiting, diarrhea, bright red blood per rectum, melena, or hematemesis Neurologic:  No visual changes, wkns, changes in mental status. All other systems reviewed and are otherwise negative except as noted above.  Physical Exam    VS:  There were no vitals taken for  this visit. , BMI There is no height or weight on file to calculate BMI. GEN: Well nourished, well developed, in no acute distress. HEENT: normal. Neck: Supple, no JVD, carotid bruits, or masses. Cardiac: RRR, no murmurs, rubs, or gallops. No clubbing, cyanosis, edema.  Radials/DP/PT 2+ and equal bilaterally.  Respiratory:  Respirations regular and unlabored, clear to auscultation bilaterally. GI: Soft, nontender, nondistended, BS + x 4. MS: no deformity or atrophy. Skin: warm and dry, no rash. Neuro:  Strength and sensation are intact. Psych: Normal affect.  Accessory Clinical Findings    Recent Labs: No results found for requested labs within last 365 days.   Recent Lipid Panel No results found for: "CHOL", "TRIG", "HDL", "CHOLHDL", "VLDL", "LDLCALC", "LDLDIRECT"  No BP recorded.  {Refresh Note OR Click here to enter BP  :1}***    ECG personally reviewed by me today- ***     Echocardiogram 01/06/2024  IMPRESSIONS   1. Left ventricular ejection fraction, by estimation, is 55 to 60%. The left ventricle has normal function. The left ventricle has no regional wall motion abnormalities. Left ventricular diastolic parameters are consistent with Grade I diastolic  dysfunction (impaired relaxation). 2. Right ventricular systolic function is normal. The right ventricular size is normal. Tricuspid regurgitation signal is inadequate for assessing PA pressure. 3. The mitral valve is degenerative. Trivial mitral valve regurgitation. No evidence of mitral stenosis. 4. The aortic valve is tricuspid. There is mild calcification of the aortic valve. There is mild thickening of the aortic valve. Aortic valve regurgitation is mild. Aortic valve sclerosis is present, with no evidence of aortic valve stenosis. 5. The inferior vena cava is normal in size with greater than 50% respiratory variability, suggesting right atrial pressure of 3 mmHg.  FINDINGS Left Ventricle: Left ventricular ejection  fraction, by estimation, is 55 to 60%. The left ventricle has normal function. The left ventricle has no regional wall motion abnormalities. The left ventricular internal cavity size was normal in size. There is no left ventricular hypertrophy. Left ventricular diastolic parameters are consistent with Grade I diastolic dysfunction (impaired relaxation).  Right Ventricle: The right ventricular size is normal. No increase in right ventricular wall thickness. Right ventricular systolic function is normal. Tricuspid regurgitation signal is inadequate for assessing  PA pressure.  Left Atrium: Left atrial size was normal in size.  Right Atrium: Right atrial size was normal in size.  Pericardium: There is no evidence of pericardial effusion. Presence of epicardial fat layer.  Mitral Valve: The mitral valve is degenerative in appearance. Mild to moderate mitral annular calcification. Trivial mitral valve regurgitation. No evidence of mitral valve stenosis.  Tricuspid Valve: The tricuspid valve is grossly normal. Tricuspid valve regurgitation is trivial. No evidence of tricuspid stenosis.  Aortic Valve: The aortic valve is tricuspid. There is mild calcification of the aortic valve. There is mild thickening of the aortic valve. Aortic valve regurgitation is mild. Aortic regurgitation PHT measures 671 msec. Aortic valve sclerosis is present, with no evidence of aortic valve stenosis. Aortic valve mean gradient measures 3.0 mmHg. Aortic valve peak gradient measures 6.0 mmHg. Aortic valve area, by VTI measures 1.83 cm.  Pulmonic Valve: The pulmonic valve was grossly normal. Pulmonic valve regurgitation is trivial. No evidence of pulmonic stenosis.  Aorta: The aortic root is normal in size and structure.  Venous: The inferior vena cava is normal in size with greater than 50% respiratory variability, suggesting right atrial pressure of 3 mmHg.  IAS/Shunts: The atrial septum is grossly normal.   Vascular  ultrasound 01/03/2024 Summary: Right Carotid: Normal: little or no evidence of plaque visualized in the right internal carotid artery. Left Carotid: Mild: plaque visualized in the left internal carotid artery.  Right ABI: The right ankle brachial index is within normal limits, suggesting the absence of significant arterial obstruction at rest. Left ABI: The left ankle brachial index is within normal limits, suggesting the absence of significant arterial obstruction at rest.  Aorta: There is no evidence of an abdominal aortic aneurysm.       Assessment & Plan   1.  ***   Thomasene Ripple. Kashvi Prevette NP-C     01/13/2024, 1:13 PM [facility] [address] Office [phone] Fax [phone]    I spent***minutes examining this patient, reviewing medications, and using patient centered shared decision making involving their cardiac care.   I spent greater than 20 minutes reviewing their past medical history,  medications, and prior cardiac tests.

## 2024-01-14 ENCOUNTER — Ambulatory Visit (INDEPENDENT_AMBULATORY_CARE_PROVIDER_SITE_OTHER): Payer: Medicare PPO | Admitting: Sports Medicine

## 2024-01-14 ENCOUNTER — Encounter: Payer: Self-pay | Admitting: Sports Medicine

## 2024-01-14 VITALS — BP 116/87 | HR 72 | Temp 97.3°F | Resp 18 | Ht 61.0 in | Wt 181.4 lb

## 2024-01-14 DIAGNOSIS — R197 Diarrhea, unspecified: Secondary | ICD-10-CM | POA: Diagnosis not present

## 2024-01-14 DIAGNOSIS — R300 Dysuria: Secondary | ICD-10-CM | POA: Diagnosis not present

## 2024-01-14 DIAGNOSIS — M545 Low back pain, unspecified: Secondary | ICD-10-CM | POA: Diagnosis not present

## 2024-01-14 DIAGNOSIS — R350 Frequency of micturition: Secondary | ICD-10-CM | POA: Diagnosis not present

## 2024-01-14 LAB — POCT URINALYSIS DIPSTICK
Glucose, UA: NEGATIVE
Nitrite, UA: NEGATIVE
Protein, UA: POSITIVE — AB
Spec Grav, UA: 1.02 (ref 1.010–1.025)
Urobilinogen, UA: 0.2 U/dL
pH, UA: 6.5 (ref 5.0–8.0)

## 2024-01-14 MED ORDER — SULFAMETHOXAZOLE-TRIMETHOPRIM 800-160 MG PO TABS: 1.0000 | ORAL_TABLET | Freq: Two times a day (BID) | ORAL | 0 refills | Status: DC

## 2024-01-14 NOTE — Progress Notes (Signed)
Careteam: Patient Care Team: Carolyn Sheffield, MD as PCP - General (Internal Medicine)  PLACE OF SERVICE:  [facility]  Advanced Directive information Does Patient Have a Medical Advance Directive?: Yes, Type of Advance Directive: Healthcare Power of [place];Living will, Does patient want to make changes to medical advance directive?: No - Patient declined  Allergies  Allergen Reactions   Amoxicillin-Pot Clavulanate Itching and Rash   Shrimp Extract Nausea And Vomiting   Codeine Nausea And Vomiting and Nausea Only    Chief Complaint  Patient presents with   Acute Visit    Patient complains of dysuria/possible UTI.      Discussed the use of AI scribe software for clinical note transcription with the patient, who gave verbal consent to proceed.  History of Present Illness   The patient, presented with urinary symptoms that started on Saturday. She reported frequent urination, with a sensation of needing to go but only producing a small amount of urine each time. She also described a burning pain during urination. She denied any fever, nausea, vomiting, or blood in the urine.  In addition to the urinary symptoms, the patient reported lower back pain that started about a week ago, prior to the onset of the urinary symptoms. The pain was constant and worsened with movement and bending over. The pain was also present while sitting and was rated as a 6 or 7 out of 10 in severity when walking or trying to do something. The patient reported taking Tylenol for the pain.   The patient also reported intermittent loose stools every two to three weeks, which she attributed to her irritable bowel syndrome. She denied any recent changes in appetite.   The patient had recent blood work done in December, and there were no reported problems with kidney function.  Need records from previous PCP.       Review of Systems:  Review of Systems  Constitutional:  Negative for chills and fever.   Eyes:  Negative for double vision.  Respiratory:  Negative for cough, sputum production and shortness of breath.   Cardiovascular:  Negative for chest pain, palpitations and leg swelling.  Gastrointestinal:  Negative for abdominal pain, heartburn and nausea.  Genitourinary:  Positive for dysuria, frequency and urgency. Negative for hematuria.  Musculoskeletal:  Positive for back pain. Negative for falls and myalgias.  Neurological:  Negative for dizziness, sensory change and focal weakness.   Negative unless indicated in HPI.   Past Medical History:  Diagnosis Date   Hyperlipidemia    Hypertension    Migraine    Osteoarthritis of both knees    Past Surgical History:  Procedure Laterality Date   ABSCESS DRAINAGE  2018   Intracranial empyema/masticator space abscess   BIOPSY BREAST     CESAREAN SECTION     CHOLECYSTECTOMY     DG GALL BLADDER  2000   LEFT OOPHORECTOMY     pelvis and verterbrae  2022   VAGINAL HYSTERECTOMY  1979   Social History:   reports that she has never smoked. She has never used smokeless tobacco. She reports that she does not currently use alcohol. She reports that she does not currently use drugs.  Family History  Problem Relation Age of Onset   Heart failure Mother    Arthritis-Osteo Mother    Stroke Father    Heart attack Father    Stroke Brother     Medications: Patient's Medications  New Prescriptions   No medications on file  Previous  Medications   ALBUTEROL (PROVENTIL) (2.5 MG/3ML) 0.083% NEBULIZER SOLUTION    Take 2.5 mg by nebulization every 4 (four) hours as needed.   DICLOFENAC SODIUM (VOLTAREN) 1 % GEL    Apply 4 g topically 4 (four) times daily.   ESTRADIOL (ESTRACE) 0.1 MG/GM VAGINAL CREAM    Place 1 Applicatorful vaginally daily. APPLY DIME SIZE DAILY TO VAGINAL OPENING   FLUTICASONE (FLONASE) 50 MCG/ACT NASAL SPRAY    Place 2 sprays into both nostrils daily.   FUROSEMIDE (LASIX) 40 MG TABLET    Take 1 tablet (40 mg total) by  mouth daily.   MIRTAZAPINE (REMERON) 15 MG TABLET    Take 1 tablet (15 mg total) by mouth at bedtime.   MULTIPLE VITAMINS-MINERALS (CENTRUM SILVER 50+WOMEN) TABS    Take 1 tablet by mouth daily at 6 (six) AM.   POTASSIUM CHLORIDE SA (KLOR-CON M) 20 MEQ TABLET    Take 1 tablet (20 mEq total) by mouth daily.   SERTRALINE (ZOLOFT) 100 MG TABLET    Take 150 mg by mouth every morning.  Modified Medications   No medications on file  Discontinued Medications   PROPRANOLOL (INDERAL) 20 MG TABLET    Take 1 tablet (20 mg total) by mouth daily.    Physical Exam: Vitals:   01/14/24 1513  BP: 116/87  Pulse: 72  Resp: 18  Temp: (!) 97.3 F (36.3 C)  SpO2: 99%  Weight: 181 lb 6.4 oz (82.3 kg)  Height: 5\' 1"  (1.549 m)   Body mass index is 34.28 kg/m. BP Readings from Last 3 Encounters:  01/14/24 116/87  01/01/24 124/70  12/04/23 120/74   Wt Readings from Last 3 Encounters:  01/14/24 181 lb 6.4 oz (82.3 kg)  01/01/24 177 lb 9.6 oz (80.6 kg)  12/04/23 183 lb (83 kg)    Physical Exam Constitutional:      Appearance: Normal appearance.  HENT:     Head: Normocephalic and atraumatic.  Cardiovascular:     Rate and Rhythm: Normal rate and regular rhythm.  Pulmonary:     Effort: Pulmonary effort is normal. No respiratory distress.     Breath sounds: Normal breath sounds. No wheezing.  Abdominal:     General: Bowel sounds are normal. There is no distension.     Tenderness: There is abdominal tenderness (lower abdominal tenderness). There is no guarding or rebound.     Comments:    Musculoskeletal:        General: No swelling or tenderness.     Comments: No spinal tenderness  Neurological:     Mental Status: She is alert. Mental status is at baseline.     Sensory: No sensory deficit.     Motor: No weakness.     Labs reviewed: Basic Metabolic Panel: No results for input(s): "NA", "K", "CL", "CO2", "GLUCOSE", "BUN", "CREATININE", "CALCIUM", "MG", "PHOS", "TSH" in the last 8760  hours. Liver Function Tests: No results for input(s): "AST", "ALT", "ALKPHOS", "BILITOT", "PROT", "ALBUMIN" in the last 8760 hours. No results for input(s): "LIPASE", "AMYLASE" in the last 8760 hours. No results for input(s): "AMMONIA" in the last 8760 hours. CBC: No results for input(s): "WBC", "NEUTROABS", "HGB", "HCT", "MCV", "PLT" in the last 8760 hours. Lipid Panel: No results for input(s): "CHOL", "HDL", "LDLCALC", "TRIG", "CHOLHDL", "LDLDIRECT" in the last 8760 hours. TSH: No results for input(s): "TSH" in the last 8760 hours. A1C: No results found for: "HGBA1C"   Assessment/Plan Urinary Tract Infection Dysuria, frequency, and lower abdominal pain since Saturday.  Urinalysis positive for infection. -Send urine for culture. -Start Bactrim DS, one tablet twice daily for 5 days.   Lower Back Pain Chronic back pain exacerbated by movement and bending, with a history of compression fracture. Pain level 6-7/10 when active. -Continue Tylenol as needed for pain.  Intermittent Diarrhea Reports of loose stools every 2-3 weeks, with a history of irritable bowel syndrome. -Advise over-the-counter Imodium for episodes of loose stools. -Consider probiotics for gut health.     No follow-ups on file.:   Burch [place]

## 2024-01-15 ENCOUNTER — Ambulatory Visit: Payer: Medicare PPO | Admitting: General Practice

## 2024-01-16 DIAGNOSIS — M25561 Pain in right knee: Secondary | ICD-10-CM | POA: Diagnosis not present

## 2024-01-16 DIAGNOSIS — M25562 Pain in left knee: Secondary | ICD-10-CM | POA: Diagnosis not present

## 2024-01-17 LAB — URINE CULTURE
MICRO NUMBER:: 15982069
SPECIMEN QUALITY:: ADEQUATE

## 2024-01-20 DIAGNOSIS — M25562 Pain in left knee: Secondary | ICD-10-CM | POA: Diagnosis not present

## 2024-01-20 DIAGNOSIS — M25561 Pain in right knee: Secondary | ICD-10-CM | POA: Diagnosis not present

## 2024-01-21 ENCOUNTER — Encounter (HOSPITAL_COMMUNITY): Payer: Self-pay | Admitting: Cardiology

## 2024-01-28 DIAGNOSIS — M25562 Pain in left knee: Secondary | ICD-10-CM | POA: Diagnosis not present

## 2024-01-28 DIAGNOSIS — M25561 Pain in right knee: Secondary | ICD-10-CM | POA: Diagnosis not present

## 2024-02-05 ENCOUNTER — Ambulatory Visit (INDEPENDENT_AMBULATORY_CARE_PROVIDER_SITE_OTHER): Payer: Medicare PPO | Admitting: Sports Medicine

## 2024-02-05 ENCOUNTER — Encounter: Payer: Self-pay | Admitting: Sports Medicine

## 2024-02-05 VITALS — BP 140/88 | HR 70 | Temp 96.0°F | Resp 16 | Ht 61.0 in | Wt 181.2 lb

## 2024-02-05 DIAGNOSIS — R0982 Postnasal drip: Secondary | ICD-10-CM

## 2024-02-05 DIAGNOSIS — R0602 Shortness of breath: Secondary | ICD-10-CM | POA: Diagnosis not present

## 2024-02-05 DIAGNOSIS — M170 Bilateral primary osteoarthritis of knee: Secondary | ICD-10-CM

## 2024-02-05 DIAGNOSIS — F320 Major depressive disorder, single episode, mild: Secondary | ICD-10-CM

## 2024-02-05 DIAGNOSIS — Z9181 History of falling: Secondary | ICD-10-CM | POA: Diagnosis not present

## 2024-02-05 DIAGNOSIS — G43809 Other migraine, not intractable, without status migrainosus: Secondary | ICD-10-CM

## 2024-02-05 MED ORDER — PROPRANOLOL HCL 20 MG PO TABS: 10.0000 mg | ORAL_TABLET | Freq: Every day | ORAL | Status: AC

## 2024-02-05 NOTE — Progress Notes (Signed)
Careteam: Patient Care Team: Venita Sheffield, MD as PCP - General (Internal Medicine)  PLACE OF SERVICE:  [facility]  Advanced Directive information    Allergies  Allergen Reactions   Amoxicillin-Pot Clavulanate Itching and Rash   Shrimp Extract Nausea And Vomiting   Codeine Nausea And Vomiting and Nausea Only    Chief Complaint  Patient presents with   Follow-up    1 month follow up and discuss Medicare Annual Wellness Visit,covid,and tdap vaccines.     Discussed the use of AI scribe software for clinical note transcription with the patient, who gave verbal consent to proceed.  History of Present Illness   Carolyn Yang is an 83 year old female who presents for follow-up    She experiences chronic knee pain with variable intensity, sometimes necessitating the use of a walker. Physical therapy twice weekly has been beneficial, though previous steroid and gel injections offered minimal relief. She manages pain with Tylenol Arthritis as needed.  She reports morning back pain that does not improve with movement. No chest pain, heart palpitations, or dizziness when standing, although occasional dizziness is noted.  She experiences burning sensations in her legs upon waking, which improve after getting up and moving.    Significant improvement in voice and nasal symptoms has been noted since starting allergy medication.  Anxiety medication has alleviated symptoms such as panting and heavy breathing.  Her medication regimen includes Crestor, propranolol, Estrace vaginal cream, Flonase, and Remeron. She has stopped Lasix and potassium, completed a course of Bactrim, and is taking Zoloft for depression. She is not using her albuterol inhaler or Trelegy. There is some confusion about her current medications,     No recent migraines and no use of her inhaler as she is not experiencing wheezing.         Review of Systems:  Review of Systems  Constitutional:  Negative for  chills and fever.  HENT:  Negative for congestion and sore throat.   Eyes:  Negative for double vision.  Respiratory:  Negative for cough, sputum production and shortness of breath.   Cardiovascular:  Negative for chest pain, palpitations and leg swelling.  Gastrointestinal:  Negative for abdominal pain, heartburn and nausea.  Genitourinary:  Negative for dysuria, frequency and hematuria.  Musculoskeletal:  Positive for back pain and joint pain. Negative for falls and myalgias.  Neurological:  Negative for dizziness, sensory change and focal weakness.   Negative unless indicated in HPI.   Past Medical History:  Diagnosis Date   Hyperlipidemia    Hypertension    Migraine    Osteoarthritis of both knees    Past Surgical History:  Procedure Laterality Date   ABSCESS DRAINAGE  2018   Intracranial empyema/masticator space abscess   BIOPSY BREAST     CESAREAN SECTION     CHOLECYSTECTOMY     DG GALL BLADDER  2000   LEFT OOPHORECTOMY     pelvis and verterbrae  2022   VAGINAL HYSTERECTOMY  1979   Social History:   reports that she has never smoked. She has never used smokeless tobacco. She reports that she does not currently use alcohol. She reports that she does not currently use drugs.  Family History  Problem Relation Age of Onset   Heart failure Mother    Arthritis-Osteo Mother    Stroke Father    Heart attack Father    Stroke Brother     Medications: Patient's Medications  New Prescriptions   No medications on file  Previous Medications   ALENDRONATE (FOSAMAX) 70 MG TABLET    Take 70 mg by mouth once a week.   DICLOFENAC SODIUM (VOLTAREN) 1 % GEL    Apply 4 g topically 4 (four) times daily.   ESTRADIOL (ESTRACE) 0.1 MG/GM VAGINAL CREAM    Place 1 Applicatorful vaginally daily. APPLY DIME SIZE DAILY TO VAGINAL OPENING   FLUTICASONE (FLONASE) 50 MCG/ACT NASAL SPRAY    Place 2 sprays into both nostrils daily.   FUROSEMIDE (LASIX) 40 MG TABLET    Take 1 tablet (40 mg  total) by mouth daily.   MIRTAZAPINE (REMERON) 15 MG TABLET    Take 1 tablet (15 mg total) by mouth at bedtime.   MULTIPLE VITAMINS-MINERALS (CENTRUM SILVER 50+WOMEN) TABS    Take 1 tablet by mouth daily at 6 (six) AM.   POTASSIUM CHLORIDE SA (KLOR-CON M) 20 MEQ TABLET    Take 1 tablet (20 mEq total) by mouth daily.   ROSUVASTATIN (CRESTOR) 5 MG TABLET    Take 5 mg by mouth at bedtime.   SERTRALINE (ZOLOFT) 100 MG TABLET    Take 150 mg by mouth every morning.  Modified Medications   Modified Medication Previous Medication   PROPRANOLOL (INDERAL) 20 MG TABLET propranolol (INDERAL) 20 MG tablet      Take 0.5 tablets (10 mg total) by mouth daily.    Take 20 mg by mouth 2 (two) times daily.  Discontinued Medications   ALBUTEROL (PROVENTIL) (2.5 MG/3ML) 0.083% NEBULIZER SOLUTION    Take 2.5 mg by nebulization every 4 (four) hours as needed.   SULFAMETHOXAZOLE-TRIMETHOPRIM (BACTRIM DS) 800-160 MG TABLET    Take 1 tablet by mouth 2 (two) times daily.    Physical Exam: Vitals:   02/05/24 1515  BP: (!) 140/88  Pulse: 70  Resp: 16  Temp: (!) 96 F (35.6 C)  SpO2: 97%  Weight: 181 lb 3.2 oz (82.2 kg)  Height: 5\' 1"  (1.549 m)   Body mass index is 34.24 kg/m. BP Readings from Last 3 Encounters:  02/05/24 (!) 140/88  01/14/24 116/87  01/01/24 124/70   Wt Readings from Last 3 Encounters:  02/05/24 181 lb 3.2 oz (82.2 kg)  01/14/24 181 lb 6.4 oz (82.3 kg)  01/01/24 177 lb 9.6 oz (80.6 kg)    Physical Exam Constitutional:      Appearance: Normal appearance.  HENT:     Head: Normocephalic and atraumatic.  Cardiovascular:     Rate and Rhythm: Normal rate and regular rhythm.  Pulmonary:     Effort: Pulmonary effort is normal. No respiratory distress.     Breath sounds: Normal breath sounds. No wheezing.  Abdominal:     General: Bowel sounds are normal. There is no distension.     Tenderness: There is no abdominal tenderness. There is no guarding or rebound.     Comments:     Musculoskeletal:        General: No swelling or tenderness.  Skin:    General: Skin is dry.  Neurological:     Mental Status: She is alert. Mental status is at baseline.     Sensory: No sensory deficit.     Motor: No weakness.     Labs reviewed: Basic Metabolic Panel: No results for input(s): "NA", "K", "CL", "CO2", "GLUCOSE", "BUN", "CREATININE", "CALCIUM", "MG", "PHOS", "TSH" in the last 8760 hours. Liver Function Tests: No results for input(s): "AST", "ALT", "ALKPHOS", "BILITOT", "PROT", "ALBUMIN" in the last 8760 hours. No results for input(s): "LIPASE", "AMYLASE" in  the last 8760 hours. No results for input(s): "AMMONIA" in the last 8760 hours. CBC: No results for input(s): "WBC", "NEUTROABS", "HGB", "HCT", "MCV", "PLT" in the last 8760 hours. Lipid Panel: No results for input(s): "CHOL", "HDL", "LDLCALC", "TRIG", "CHOLHDL", "LDLDIRECT" in the last 8760 hours. TSH: No results for input(s): "TSH" in the last 8760 hours. A1C: No results found for: "HGBA1C"  Assessment and Plan    Knee Pain Variable pain with some days requiring a walker for mobility. Currently attending physical therapy twice a week and performing exercises at home on non-therapy days. Pain is likely due to chronic degeneration and bone-on-bone contact. -Continue physical therapy and home exercises. -Use Tylenol arthritis as needed for pain, taking care not to wait until pain is severe before administration.  Allergic Rhinitis Improvement in voice and nasal drip since starting allergy medication. -Continue Flonase as currently prescribed.  Anxiety Improvement in panting and heavy breathing since starting anxiety medication. -Continue Zoloft (sertraline) as currently prescribed.  Sob  Lungs clear  No wheezing Follow up with cardiology     Migraine  Currently taking propranolol for migraines, but has not had a migraine in years. -Reduce propranolol to 10mg  daily for two weeks, then  discontinue.  General Health Maintenance -Continue Estrace vaginal cream as currently prescribed. -Check with cardiologist about need for Lasix and potassium. -Confirm with cardiologist about need for Fosamax. -Obtain vaccination records from local pharmacy to confirm status of COVID booster and tetanus booster. -Follow-up in clinic in 3-4 months.          Return in about 3 months (around 05/04/2024), or need records from previous PCP.:

## 2024-02-05 NOTE — Progress Notes (Unsigned)
Cardiology Clinic Note   Patient Name: Carolyn Yang Date of Encounter: 02/06/2024  Primary Care Provider:  No primary care provider on file. Primary Cardiologist:  Thurmon Fair, MD  Patient Profile    Carolyn Yang 83 year old female presents to the clinic today for follow-up evaluation of her shortness of breath.  Past Medical History    Past Medical History:  Diagnosis Date   Hyperlipidemia    Hypertension    Migraine    Osteoarthritis of both knees    Past Surgical History:  Procedure Laterality Date   ABSCESS DRAINAGE  2018   Intracranial empyema/masticator space abscess   BIOPSY BREAST     CESAREAN SECTION     CHOLECYSTECTOMY     DG GALL BLADDER  2000   LEFT OOPHORECTOMY     pelvis and verterbrae  2022   VAGINAL HYSTERECTOMY  1979    Allergies  Allergies  Allergen Reactions   Amoxicillin-Pot Clavulanate Itching and Rash   Shrimp Extract Nausea And Vomiting   Codeine Nausea And Vomiting and Nausea Only    History of Present Illness    Carolyn Yang has a PMH of HTN, HLD, progressive dyspnea on exertion, and recurrent falls.  She was seen for evaluation by Dr. Royann Shivers on 12/04/2023.  She reported recurrent falls and progressive dyspnea on exertion.  Echocardiogram was ordered and showed LVEF of 55-60% and G1 DD.  Vascular ultrasound was also ordered and showed no meaningful plaque.  She had described her falls as sudden.  She would end up on her back.  She did note a sensation of her head going blank.  She had no significant injuries.  She also noted increased work of breathing/DOE with activities like dressing, showering, and cooking.  She would feel exhausted.  She denied dyspnea at rest and with laying flat.  She was noted to have lower extremity swelling.  She reported that her lower extremity support stockings started feeling tight around her knees.  She had been wearing her lower extremity support stockings for approximately 3 to 4 years due to varicose  veins.  She reported decrease in her appetite and generalized weakness particularly in the mornings.  She reported that her spouse had passed away recently.  She has been dealing with grief and anxiety.  She was living with her daughter who is assisting her with her daily activities and was advocating for her to return to physical therapy.  It was felt that her lower extremity muscle weakness may be related to her rosuvastatin.  She was given a statin holiday.    She contacted the clinic via MyChart and reported that she continued to have difficulty with mobility and physical activities.  Dr. Royann Shivers reviewed messages and felt that her weakness was due to skill skeletal/joint problems rather than statin therapy.  He hoped to restart statin therapy eventually.  She presents to the clinic today for evaluation and states she is now doing physical therapy 2 times per week.  She is feeling much stronger.  Her breathing is somewhat better.  She has a Teacher, adult education.  She is taking Flonase and Zyrtec.  She is no longer taking furosemide and potassium.  We reviewed her rosuvastatin therapy.  She has not had any change in her muscle and joint pain.  She does note that her knee does not hurt when she is riding on a recumbent type bicycle.  I will resume her rosuvastatin 5 mg daily.  We will plan for repeat lab  work in 6 to 8 weeks and plan follow-up in 3 to 4 months..  Today she denies chest pain, shortness of breath, lower extremity edema, fatigue, palpitations, melena, hematuria, hemoptysis, diaphoresis, weakness, presyncope, syncope, orthopnea, and PND.   Home Medications    Prior to Admission medications   Medication Sig Start Date End Date Taking? Authorizing Provider  albuterol (PROVENTIL) (2.5 MG/3ML) 0.083% nebulizer solution Take 2.5 mg by nebulization every 4 (four) hours as needed. 08/13/23   [provider]  diclofenac Sodium (VOLTAREN) 1 % GEL Apply 4 g topically 4 (four) times daily.  09/25/23   [provider]  estradiol (ESTRACE) 0.1 MG/GM vaginal cream Place 1 Applicatorful vaginally daily. APPLY DIME SIZE DAILY TO VAGINAL OPENING    [provider]  fluticasone (FLONASE) 50 MCG/ACT nasal spray Place 2 sprays into both nostrils daily. 01/01/24   Venita Sheffield, MD  furosemide (LASIX) 40 MG tablet Take 1 tablet (40 mg total) by mouth daily. 12/04/23 03/03/24  Croitoru, Mihai, MD  mirtazapine (REMERON) 15 MG tablet Take 1 tablet (15 mg total) by mouth at bedtime. 01/09/24   Venita Sheffield, MD  Multiple Vitamins-Minerals (CENTRUM SILVER 50+WOMEN) TABS Take 1 tablet by mouth daily at 6 (six) AM.    [provider]  potassium chloride SA (KLOR-CON M) 20 MEQ tablet Take 1 tablet (20 mEq total) by mouth daily. 12/04/23   Croitoru, Mihai, MD  propranolol (INDERAL) 20 MG tablet Take 1 tablet (20 mg total) by mouth daily. 01/01/24   Venita Sheffield, MD  sertraline (ZOLOFT) 100 MG tablet Take 150 mg by mouth every morning. 11/23/23   [provider]    Family History    Family History  Problem Relation Age of Onset   Heart failure Mother    Arthritis-Osteo Mother    Stroke Father    Heart attack Father    Stroke Brother    She indicated that the status of her mother is unknown. She indicated that the status of her father is unknown. She indicated that the status of her brother is unknown.  Social History    Social History   Socioeconomic History   Marital status: Widowed    Spouse name: Not on file   Number of children: Not on file   Years of education: Not on file   Highest education level: Bachelor's degree (e.g., BA, AB, BS)  Occupational History   Not on file  Tobacco Use   Smoking status: Never   Smokeless tobacco: Never  Vaping Use   Vaping status: Never Used  Substance and Sexual Activity   Alcohol use: Not Currently   Drug use: Not Currently   Sexual activity: Not Currently  Other Topics Concern   Not on  file  Social History Narrative   Diet:      Caffeine:yes       Married, if yes what year:       Do you live in a house, apartment, assisted living, condo, trailer, ect: townhouse      Is it one or more stories: 1      How many persons live in your home?2      Pets:0      Highest level or education completed: BA       Current/Past profession:Teacher      Exercise:                  Type and how often:  Living Will:yes   DNR:   POA/HPOA:yes      Functional Status:   Do you have difficulty bathing or dressing yourself?no   Do you have difficulty preparing food or eating?no   Do you have difficulty managing your medications?no   Do you have difficulty managing your finances?no    Do you have difficulty affording your medications? no   Social Drivers of Corporate investment banker Strain: Not on file  Food Insecurity: Not on file  Transportation Needs: Not on file  Physical Activity: Not on file  Stress: Not on file  Social Connections: Not on file  Intimate Partner Violence: Not on file     Review of Systems    General:  No chills, fever, night sweats or weight changes.  Cardiovascular:  No chest pain, dyspnea on exertion, edema, orthopnea, palpitations, paroxysmal nocturnal dyspnea. Dermatological: No rash, lesions/masses Respiratory: No cough, dyspnea Urologic: No hematuria, dysuria Abdominal:   No nausea, vomiting, diarrhea, bright red blood per rectum, melena, or hematemesis Neurologic:  No visual changes, wkns, changes in mental status. All other systems reviewed and are otherwise negative except as noted above.  Physical Exam    VS:  BP 130/80 (BP Location: Left Arm, Patient Position: Sitting, Cuff Size: Normal)   Pulse 88   Ht 5\' 1"  (1.549 m)   Wt 180 lb 3.2 oz (81.7 kg)   SpO2 98%   BMI 34.05 kg/m  , BMI Body mass index is 34.05 kg/m. GEN: Well nourished, well developed, in no acute distress. HEENT: normal. Neck: Supple, no JVD, carotid  bruits, or masses. Cardiac: RRR, no murmurs, rubs, or gallops. No clubbing, cyanosis, edema.  Radials/DP/PT 2+ and equal bilaterally.  Respiratory:  Respirations regular and unlabored, clear to auscultation bilaterally. GI: Soft, nontender, nondistended, BS + x 4. MS: no deformity or atrophy. Skin: warm and dry, no rash. Neuro:  Strength and sensation are intact. Psych: Normal affect.  Accessory Clinical Findings    Recent Labs: No results found for requested labs within last 365 days.   Recent Lipid Panel No results found for: "CHOL", "TRIG", "HDL", "CHOLHDL", "VLDL", "LDLCALC", "LDLDIRECT"       ECG personally reviewed by me today-none today.     Echocardiogram 01/06/2024  IMPRESSIONS   1. Left ventricular ejection fraction, by estimation, is 55 to 60%. The left ventricle has normal function. The left ventricle has no regional wall motion abnormalities. Left ventricular diastolic parameters are consistent with Grade I diastolic  dysfunction (impaired relaxation). 2. Right ventricular systolic function is normal. The right ventricular size is normal. Tricuspid regurgitation signal is inadequate for assessing PA pressure. 3. The mitral valve is degenerative. Trivial mitral valve regurgitation. No evidence of mitral stenosis. 4. The aortic valve is tricuspid. There is mild calcification of the aortic valve. There is mild thickening of the aortic valve. Aortic valve regurgitation is mild. Aortic valve sclerosis is present, with no evidence of aortic valve stenosis. 5. The inferior vena cava is normal in size with greater than 50% respiratory variability, suggesting right atrial pressure of 3 mmHg.  FINDINGS Left Ventricle: Left ventricular ejection fraction, by estimation, is 55 to 60%. The left ventricle has normal function. The left ventricle has no regional wall motion abnormalities. The left ventricular internal cavity size was normal in size. There is no left ventricular  hypertrophy. Left ventricular diastolic parameters are consistent with Grade I diastolic dysfunction (impaired relaxation).  Right Ventricle: The right ventricular size is normal. No increase in  right ventricular wall thickness. Right ventricular systolic function is normal. Tricuspid regurgitation signal is inadequate for assessing PA pressure.  Left Atrium: Left atrial size was normal in size.  Right Atrium: Right atrial size was normal in size.  Pericardium: There is no evidence of pericardial effusion. Presence of epicardial fat layer.  Mitral Valve: The mitral valve is degenerative in appearance. Mild to moderate mitral annular calcification. Trivial mitral valve regurgitation. No evidence of mitral valve stenosis.  Tricuspid Valve: The tricuspid valve is grossly normal. Tricuspid valve regurgitation is trivial. No evidence of tricuspid stenosis.  Aortic Valve: The aortic valve is tricuspid. There is mild calcification of the aortic valve. There is mild thickening of the aortic valve. Aortic valve regurgitation is mild. Aortic regurgitation PHT measures 671 msec. Aortic valve sclerosis is present, with no evidence of aortic valve stenosis. Aortic valve mean gradient measures 3.0 mmHg. Aortic valve peak gradient measures 6.0 mmHg. Aortic valve area, by VTI measures 1.83 cm.  Pulmonic Valve: The pulmonic valve was grossly normal. Pulmonic valve regurgitation is trivial. No evidence of pulmonic stenosis.  Aorta: The aortic root is normal in size and structure.  Venous: The inferior vena cava is normal in size with greater than 50% respiratory variability, suggesting right atrial pressure of 3 mmHg.  IAS/Shunts: The atrial septum is grossly normal.   Vascular ultrasound 01/03/2024 Summary: Right Carotid: Normal: little or no evidence of plaque visualized in the right internal carotid artery. Left Carotid: Mild: plaque visualized in the left internal carotid artery.  Right ABI: The  right ankle brachial index is within normal limits, suggesting the absence of significant arterial obstruction at rest. Left ABI: The left ankle brachial index is within normal limits, suggesting the absence of significant arterial obstruction at rest.  Aorta: There is no evidence of an abdominal aortic aneurysm.       Assessment & Plan   1.  Exertional dyspnea/DOE-breathing somewhat better.  Continues to note weakness in her lower extremities.  Echocardiogram reassuring.  Details above. Increase physical activity as tolerated-chair exercises given Continue current medical therapy Heart healthy low-sodium diet Continue lower extremity support stockings  Essential hypertension-BP today 130/80 Maintain blood pressure log Continue propranolol  Hyperlipidemia-LDL 73 on 12/03/23.  Given a statin holiday due to muscle weakness.  It does not appear that her muscle weakness is related to statin therapy. Resume rosuvastatin Repeat fasting lipids and LFTs in 6-8 weeks  Muscle weakness, falls-previously she was noted to have hoarseness/weak voice which was worse in the evening and progressive lower extremity weakness.  It was felt that this could be related to myasthenia gravis.  Denies subsequent falls Continue physical therapy   Disposition: Follow-up with Dr. Royann Shivers or me in 3 months.   Carolyn Yang. Carolyn Tena NP-C     02/06/2024, 2:55 PM [facility] [address] Office [phone] Fax [phone]    I spent  14 minutes examining this patient, reviewing medications, and using patient centered shared decision making involving their cardiac care.   I spent greater than 20 minutes reviewing their past medical history,  medications, and prior cardiac tests.

## 2024-02-05 NOTE — Patient Instructions (Signed)
Take propronolol 10 mg daily for 2-3 weeks and then stop taking

## 2024-02-06 ENCOUNTER — Ambulatory Visit: Payer: Medicare PPO | Attending: General Practice | Admitting: General Practice

## 2024-02-06 ENCOUNTER — Encounter: Payer: Self-pay | Admitting: General Practice

## 2024-02-06 VITALS — BP 130/80 | HR 88 | Ht 61.0 in | Wt 180.2 lb

## 2024-02-06 DIAGNOSIS — E7800 Pure hypercholesterolemia, unspecified: Secondary | ICD-10-CM

## 2024-02-06 DIAGNOSIS — R0602 Shortness of breath: Secondary | ICD-10-CM | POA: Diagnosis not present

## 2024-02-06 DIAGNOSIS — R29898 Other symptoms and signs involving the musculoskeletal system: Secondary | ICD-10-CM | POA: Diagnosis not present

## 2024-02-06 DIAGNOSIS — I10 Essential (primary) hypertension: Secondary | ICD-10-CM | POA: Diagnosis not present

## 2024-02-06 DIAGNOSIS — R0609 Other forms of dyspnea: Secondary | ICD-10-CM

## 2024-02-06 DIAGNOSIS — R296 Repeated falls: Secondary | ICD-10-CM | POA: Diagnosis not present

## 2024-02-06 NOTE — Patient Instructions (Addendum)
Medication Instructions:  RESTART YOUR ROSUVASTATIN  *If you need a refill on your cardiac medications before your next appointment, please call your pharmacy*  Lab Work: FASTING LIPID AND LFT IN 6-8 WEEKS If you have labs (blood work) drawn today and your tests are completely normal, you will receive your results only by:  MyChart Message (if you have MyChart) OR A paper copy in the mail If you have any lab test that is abnormal or we need to change your treatment, we will call you to review the results.  Testing/Procedures: NONE  Follow-Up: At [facility], you and your health needs are our priority.  As part of our continuing mission to provide you with exceptional heart care, we have created designated Provider Care Teams.  These Care Teams include your primary Cardiologist (physician) and Advanced Practice Providers (APPs -  Physician Assistants and Nurse Practitioners) who all work together to provide you with the care you need, when you need it.  Your next appointment:   3-4 month(s)  Provider:   Thurmon Fair, MD  or Edd Fabian, FNP

## 2024-02-11 DIAGNOSIS — M25562 Pain in left knee: Secondary | ICD-10-CM | POA: Diagnosis not present

## 2024-02-11 DIAGNOSIS — M25561 Pain in right knee: Secondary | ICD-10-CM | POA: Diagnosis not present

## 2024-02-13 DIAGNOSIS — J40 Bronchitis, not specified as acute or chronic: Secondary | ICD-10-CM | POA: Diagnosis not present

## 2024-02-18 DIAGNOSIS — M25561 Pain in right knee: Secondary | ICD-10-CM | POA: Diagnosis not present

## 2024-02-18 DIAGNOSIS — M25562 Pain in left knee: Secondary | ICD-10-CM | POA: Diagnosis not present

## 2024-02-20 DIAGNOSIS — M25561 Pain in right knee: Secondary | ICD-10-CM | POA: Diagnosis not present

## 2024-02-20 DIAGNOSIS — M25562 Pain in left knee: Secondary | ICD-10-CM | POA: Diagnosis not present

## 2024-02-26 DIAGNOSIS — M25561 Pain in right knee: Secondary | ICD-10-CM | POA: Diagnosis not present

## 2024-02-26 DIAGNOSIS — M25562 Pain in left knee: Secondary | ICD-10-CM | POA: Diagnosis not present

## 2024-03-10 DIAGNOSIS — M25562 Pain in left knee: Secondary | ICD-10-CM | POA: Diagnosis not present

## 2024-03-10 DIAGNOSIS — M25561 Pain in right knee: Secondary | ICD-10-CM | POA: Diagnosis not present

## 2024-03-12 DIAGNOSIS — J40 Bronchitis, not specified as acute or chronic: Secondary | ICD-10-CM | POA: Diagnosis not present

## 2024-03-13 DIAGNOSIS — M25562 Pain in left knee: Secondary | ICD-10-CM | POA: Diagnosis not present

## 2024-03-13 DIAGNOSIS — M25561 Pain in right knee: Secondary | ICD-10-CM | POA: Diagnosis not present

## 2024-03-17 DIAGNOSIS — M25562 Pain in left knee: Secondary | ICD-10-CM | POA: Diagnosis not present

## 2024-03-17 DIAGNOSIS — M25561 Pain in right knee: Secondary | ICD-10-CM | POA: Diagnosis not present

## 2024-03-28 ENCOUNTER — Encounter: Payer: Self-pay | Admitting: Sports Medicine

## 2024-03-30 MED ORDER — ROSUVASTATIN CALCIUM 5 MG PO TABS: 5.0000 mg | ORAL_TABLET | Freq: Every day | ORAL | 1 refills | Status: DC

## 2024-04-12 DIAGNOSIS — J40 Bronchitis, not specified as acute or chronic: Secondary | ICD-10-CM | POA: Diagnosis not present

## 2024-04-14 ENCOUNTER — Ambulatory Visit: Payer: Medicare PPO | Admitting: Cardiovascular Disease

## 2024-04-15 ENCOUNTER — Telehealth: Payer: Self-pay | Admitting: Sports Medicine

## 2024-04-15 MED ORDER — SERTRALINE HCL 100 MG PO TABS: 150.0000 mg | ORAL_TABLET | Freq: Every morning | ORAL | 3 refills | Status: AC

## 2024-04-15 NOTE — Telephone Encounter (Signed)
 Tye Gall, MD  [facility]10 minutes ago (8:57 AM)    Refilled zoloft 

## 2024-04-15 NOTE — Telephone Encounter (Addendum)
 High risk or very high risk warning populated when attempting to refill Zoloft . RX request sent to PCP for review and approval if warranted.    Side Note: I had to remove pended medication in order to route message to Dr.Veludandi

## 2024-04-20 ENCOUNTER — Encounter: Payer: Self-pay | Admitting: Adult Health

## 2024-04-20 ENCOUNTER — Ambulatory Visit (INDEPENDENT_AMBULATORY_CARE_PROVIDER_SITE_OTHER): Admitting: Adult Health

## 2024-04-20 ENCOUNTER — Ambulatory Visit: Payer: Self-pay

## 2024-04-20 VITALS — BP 126/84 | HR 83 | Temp 97.6°F | Ht 61.0 in | Wt 173.4 lb

## 2024-04-20 DIAGNOSIS — F321 Major depressive disorder, single episode, moderate: Secondary | ICD-10-CM

## 2024-04-20 DIAGNOSIS — R300 Dysuria: Secondary | ICD-10-CM | POA: Diagnosis not present

## 2024-04-20 DIAGNOSIS — F419 Anxiety disorder, unspecified: Secondary | ICD-10-CM

## 2024-04-20 DIAGNOSIS — J309 Allergic rhinitis, unspecified: Secondary | ICD-10-CM

## 2024-04-20 DIAGNOSIS — I10 Essential (primary) hypertension: Secondary | ICD-10-CM

## 2024-04-20 DIAGNOSIS — W19XXXS Unspecified fall, sequela: Secondary | ICD-10-CM | POA: Diagnosis not present

## 2024-04-20 DIAGNOSIS — N390 Urinary tract infection, site not specified: Secondary | ICD-10-CM

## 2024-04-20 LAB — POCT URINALYSIS DIPSTICK (MANUAL)
Nitrite, UA: POSITIVE — AB
Poct Bilirubin: NEGATIVE
Poct Blood: 250 — AB
Poct Glucose: NORMAL mg/dL
Poct Ketones: NEGATIVE
Poct Protein: 30 mg/dL — AB
Poct Urobilinogen: 1 mg/dL — AB
Spec Grav, UA: 1.01 (ref 1.010–1.025)
pH, UA: 6 (ref 5.0–8.0)

## 2024-04-20 MED ORDER — NITROFURANTOIN MONOHYD MACRO 100 MG PO CAPS
100.0000 mg | ORAL_CAPSULE | Freq: Two times a day (BID) | ORAL | 0 refills | Status: AC
Start: 2024-04-20 — End: 2024-04-27

## 2024-04-20 NOTE — Telephone Encounter (Signed)
 Chief Complaint: burning, frequency Symptoms: UTI, burning, frequency, frequent falls, back pain Frequency: UTI symptoms 1 wk, fall yesterday Pertinent Negatives: Patient denies fever, chills, N/V, H/A, CP, SOB, neck pain, one-sided weakness, bleeding Disposition: [] ED /[] Urgent Care (no appt availability in office) / [x] Appointment(In office/virtual)/ []  [facility]/ [] Home Care/ [] Refused Recommended Disposition /[] [facility]/ []  Follow-up with PCP Additional Notes: Pt reports dysuria, frequency, and new incontinence for 1 wk with lower back pain. Pt endorses she fell yesterday onto her face while bending over to pick something off the floor. Denies H/A, N/V, one-sided weakness, neck pain. Pt denies LOC. Denies CP or  SOB. Endorses 3 falls in the last 6 months. Endorses pain to her nose and a cut to her nose and above her eye. RN scheduled pt for 1040 today, states her daughter will take her. Advised pt to go to the ED for worsening.    Copied from CRM [id number]. Topic: Clinical - Red Word Triage >> Apr 20, 2024  9:09 AM Retta Caster wrote: Red Word that prompted transfer to Nurse Triage: Patient Possible UTI Symptoms: for 1 week-Urinate a lot /Burns Reason for Disposition  Side (flank) or lower back pain present  Answer Assessment - Initial Assessment Questions 1. SYMPTOM: "What's the main symptom you're concerned about?" (e.g., frequency, incontinence)     Burning and frequency 2. ONSET: "When did the symptoms start?"     1 wk 3. PAIN: "Is there any pain?" If Yes, ask: "How bad is it?" (Scale: 1-10; mild, moderate, severe)     "A little" burning with urination, lower back pain 4. CAUSE: "What do you think is causing the symptoms?"     UTI  5. OTHER SYMPTOMS: "Do you have any other symptoms?" (e.g., blood in urine, fever, flank pain, pain with urination)     "Burns just a little bit at the end." Denies hx of UTI. Pt endorses new incontinence "I don't have any control."  Endorses lower back pain. Pt states she fell yesterday "and hit my face a little bit." Pt endorses some soreness to her tonsils. Pt states "my throat does this." Denies fever or chills.   Pt states she fell on her way to the bathroom, had her cane in her hand, bent over to pick up fuzz and "got off balance." States she fell on her face. Denies LOC  States she cut her nose and above her eye, scratch on her cheek. Endorses her nose is "sore." Denies H/A, N/V. Pt states "I stay weak." Pt states "I have times I feel off balance" (not new). Pt endorses 3 falls in the last 6 months.  Protocols used: Urinary Symptoms-A-AH

## 2024-04-20 NOTE — Telephone Encounter (Signed)
Patient was seen today in the clinic.

## 2024-04-20 NOTE — Progress Notes (Signed)
-    discussed with patient during clinic visit today, 04/20/24, started on Macrobid and sent urine for culture

## 2024-04-20 NOTE — Progress Notes (Signed)
 [facility] clinic  Provider:  Inge Mangle DNP  Code Status:  Full Code  Goals of Care:     01/14/2024    3:12 PM  Advanced Directives  Does Patient Have a Medical Advance Directive? Yes  Type of Estate agent of [place];Living will  Does patient want to make changes to medical advance directive? No - Patient declined  Copy of Healthcare Power of Attorney in Chart? No - copy requested     Chief Complaint  Patient presents with   Urinary Tract Infection    UTI  started about week ago , burning after urination, no fever lower back , Fall last night 04/19/24 injury to face and knee  , trip over  cane     Discussed the use of AI scribe software for clinical note transcription with the patient, who gave verbal consent to proceed.  HPI: Patient is a 83 y.o. female seen today for an acute visit for possible UTI. She was accompanied by her daughter.  For the past week, she has experienced symptoms indicative of a urinary tract infection, including dysuria and a strong odor. She also has urinary urgency and occasional incontinence, necessitating the use of protective garments. No hematuria is present. She denies fever but has body aches, which she attributes to arthritis.  She has a history of falls, with the most recent occurring last night when she fell face-first after losing balance while picking up an object from the floor, resulting in a facial injury and a knee contusion. She has fallen four times in total, with two falls in the last month, attributed to balance issues. She uses a walking stick and denies any loss of consciousness during these falls.  Her current medications include propranolol  10 mg daily for hypertension and anxiety, Remeron  (mirtazapine ) 15 mg at bedtime, Zoloft  (sertraline ) 150 mg daily for depression, Flonase  two sprays in each nostril daily for allergic rhinitis, and Fosamax 70 mg weekly for osteoporosis. She also takes Zyrtec and  Lactobacillus. She is allergic to Augmentin, shrimp, and codeine.    Past Medical History:  Diagnosis Date   Hyperlipidemia    Hypertension    Migraine    Osteoarthritis of both knees     Past Surgical History:  Procedure Laterality Date   ABSCESS DRAINAGE  2018   Intracranial empyema/masticator space abscess   BIOPSY BREAST     CESAREAN SECTION     CHOLECYSTECTOMY     DG GALL BLADDER  2000   LEFT OOPHORECTOMY     pelvis and verterbrae  2022   VAGINAL HYSTERECTOMY  1979    Allergies  Allergen Reactions   Amoxicillin-Pot Clavulanate Itching and Rash   Shrimp Extract Nausea And Vomiting   Codeine Nausea And Vomiting and Nausea Only    Outpatient Encounter Medications as of 04/20/2024  Medication Sig   alendronate (FOSAMAX) 70 MG tablet Take 70 mg by mouth once a week.   diclofenac Sodium (VOLTAREN) 1 % GEL Apply 4 g topically 4 (four) times daily.   fluticasone  (FLONASE ) 50 MCG/ACT nasal spray Place 2 sprays into both nostrils daily.   mirtazapine  (REMERON ) 15 MG tablet Take 1 tablet (15 mg total) by mouth at bedtime.   Multiple Vitamins-Minerals (CENTRUM SILVER 50+WOMEN) TABS Take 1 tablet by mouth daily at 6 (six) AM.   propranolol  (INDERAL ) 20 MG tablet Take 0.5 tablets (10 mg total) by mouth daily.   rosuvastatin  (CRESTOR ) 5 MG tablet Take 1 tablet (5 mg total) by mouth  at bedtime.   sertraline  (ZOLOFT ) 100 MG tablet Take 1.5 tablets (150 mg total) by mouth every morning.   [DISCONTINUED] estradiol (ESTRACE) 0.1 MG/GM vaginal cream Place 1 Applicatorful vaginally daily. APPLY DIME SIZE DAILY TO VAGINAL OPENING   No facility-administered encounter medications on file as of 04/20/2024.    Review of Systems:  Review of Systems  Constitutional:  Negative for appetite change, chills, fatigue and fever.  HENT:  Negative for congestion, hearing loss, rhinorrhea and sore throat.   Eyes: Negative.   Respiratory:  Negative for cough, shortness of breath and wheezing.    Cardiovascular:  Negative for chest pain, palpitations and leg swelling.  Gastrointestinal:  Negative for abdominal pain, constipation, diarrhea, nausea and vomiting.  Genitourinary:  Positive for decreased urine volume, dysuria and urgency.  Musculoskeletal:  Negative for arthralgias, back pain and myalgias.  Skin:  Negative for color change, rash and wound.  Neurological:  Negative for dizziness, weakness and headaches.  Psychiatric/Behavioral:  Negative for behavioral problems. The patient is not nervous/anxious.     Health Maintenance  Topic Date Due   DTaP/Tdap/Td (2 - Td or Tdap) 10/01/2023   COVID-19 Vaccine (8 - Pfizer risk 2024-25 season) 02/25/2024   Medicare Annual Wellness (AWV)  03/03/2024   INFLUENZA VACCINE  07/24/2024   Pneumonia Vaccine 29+ Years old  Completed   DEXA SCAN  Completed   Zoster Vaccines- Shingrix  Completed   HPV VACCINES  Aged Out   Meningococcal B Vaccine  Aged Out    Physical Exam: Vitals:   04/20/24 1057  BP: 126/84  Pulse: 83  Temp: 97.6 F (36.4 C)  SpO2: 94%  Weight: 173 lb 6.4 oz (78.7 kg)  Height: 5\' 1"  (1.549 m)   Body mass index is 32.76 kg/m. Physical Exam Constitutional:      General: She is not in acute distress.    Appearance: She is obese.  HENT:     Head: Normocephalic and atraumatic.     Nose: Nose normal.     Mouth/Throat:     Mouth: Mucous membranes are moist.  Eyes:     Conjunctiva/sclera: Conjunctivae normal.  Cardiovascular:     Rate and Rhythm: Normal rate and regular rhythm.  Pulmonary:     Effort: Pulmonary effort is normal.     Breath sounds: Normal breath sounds.  Abdominal:     General: Bowel sounds are normal.     Palpations: Abdomen is soft.  Musculoskeletal:        General: Normal range of motion.     Cervical back: Normal range of motion.  Skin:    General: Skin is warm and dry.     Comments: Small abrasion on bridge of nose, erythematous and dry  Neurological:     General: No focal deficit  present.     Mental Status: She is alert and oriented to person, place, and time.  Psychiatric:        Mood and Affect: Mood normal.        Behavior: Behavior normal.        Thought Content: Thought content normal.        Judgment: Judgment normal.     Labs reviewed: Basic Metabolic Panel: No results for input(s): "NA", "K", "CL", "CO2", "GLUCOSE", "BUN", "CREATININE", "CALCIUM ", "MG", "PHOS", "TSH" in the last 8760 hours. Liver Function Tests: No results for input(s): "AST", "ALT", "ALKPHOS", "BILITOT", "PROT", "ALBUMIN" in the last 8760 hours. No results for input(s): "LIPASE", "AMYLASE" in the last 8760  hours. No results for input(s): "AMMONIA" in the last 8760 hours. CBC: No results for input(s): "WBC", "NEUTROABS", "HGB", "HCT", "MCV", "PLT" in the last 8760 hours. Lipid Panel: No results for input(s): "CHOL", "HDL", "LDLCALC", "TRIG", "CHOLHDL", "LDLDIRECT" in the last 8760 hours. No results found for: "HGBA1C"  Procedures since last visit: No results found.  Assessment/Plan  1. Urinary tract infection without hematuria, site unspecified (Primary)  Dysuria -  Symptoms include burning at urination end and urgency. Awaiting urine culture. - Increase fluid intake. - Await urine culture results for further management - POCT Urinalysis Dip Manual -showed moderate leukocyte, positive nitrite, protein 30+, blood =250, -  will start on Nitrofurantoin  1 tab twice a day x 7 days - Urine Culture - nitrofurantoin , macrocrystal-monohydrate, (MACROBID ) 100 MG capsule; Take 1 capsule (100 mg total) by mouth 2 (two) times daily for 7 days.  Dispense: 14 capsule; Refill: 0  2. Fall, sequela -  Recent fall with facial injury. Balance issues likely due to walking stick use instead of walker. No serious injury or loss of consciousness. - Encourage walker use.  3. Primary hypertension -  Well-controlled at 126/84 mmHg with propranolol , also aids anxiety. - Continue propranolol  10 mg  daily.  4. Current moderate episode of major depressive disorder without prior episode (HCC) -  Stable on sertraline  and mirtazapine . No recent exacerbation. - Continue sertraline  150 mg daily. - Continue mirtazapine  15 mg at bedtime.  5. Anxiety -  stable. - Continue propranolol  10 mg daily  6. Allergic rhinitis, unspecified seasonality, unspecified trigger -  Stable on Flonase  and Zyrtec. No acute exacerbation. - Continue Flonase  two sprays in each nostril daily. - Continue Zyrtec as needed.       Labs/tests ordered:  POC urine dipstick, urine culture   Return if symptoms worsen or fail to improve.  Leetta Hendriks Medina-Vargas, NP

## 2024-04-20 NOTE — Telephone Encounter (Signed)
 Message routed to [place], NP as FYI.

## 2024-04-20 NOTE — Telephone Encounter (Signed)
 Noted.

## 2024-04-25 LAB — URINE CULTURE
MICRO NUMBER:: 16383990
SPECIMEN QUALITY:: ADEQUATE

## 2024-04-26 NOTE — Progress Notes (Signed)
-   bacteria isolate sensitive to Nitrofurantoin  which was prescribed during the office visit

## 2024-04-27 ENCOUNTER — Telehealth: Payer: Self-pay

## 2024-04-27 NOTE — Telephone Encounter (Signed)
-----   Message from [place] Medina-Vargas sent at 04/26/2024 10:13 PM EDT ----- - bacteria isolate sensitive to Nitrofurantoin  which was prescribed during the office visit

## 2024-04-27 NOTE — Telephone Encounter (Signed)
 I left a message for the patient to return my call.

## 2024-04-28 ENCOUNTER — Encounter: Payer: Self-pay | Admitting: Pulmonary Disease

## 2024-04-28 ENCOUNTER — Ambulatory Visit: Admitting: Pulmonary Disease

## 2024-04-28 ENCOUNTER — Ambulatory Visit (INDEPENDENT_AMBULATORY_CARE_PROVIDER_SITE_OTHER)

## 2024-04-28 VITALS — BP 143/82 | HR 81 | Ht 61.0 in | Wt 173.8 lb

## 2024-04-28 DIAGNOSIS — R0600 Dyspnea, unspecified: Secondary | ICD-10-CM

## 2024-04-28 DIAGNOSIS — M179 Osteoarthritis of knee, unspecified: Secondary | ICD-10-CM | POA: Diagnosis not present

## 2024-04-28 DIAGNOSIS — J302 Other seasonal allergic rhinitis: Secondary | ICD-10-CM

## 2024-04-28 DIAGNOSIS — R0689 Other abnormalities of breathing: Secondary | ICD-10-CM

## 2024-04-28 NOTE — Patient Instructions (Signed)
 VISIT SUMMARY:  Today, we discussed your ongoing shortness of breath, seasonal allergies, osteoarthritis, hypertension, and hyperlipidemia. We reviewed your symptoms, current treatments, and potential next steps to manage your conditions.  YOUR PLAN:  -SHORTNESS OF BREATH: You have been experiencing shortness of breath since August 2021, which may be related to anxiety, decreased activity levels, or an undiagnosed lung condition. Your lungs were clear on examination today. We will order a chest x-ray and schedule a lung function test to further investigate. If your symptoms persist, we may consider trying an inhaler.  -SEASONAL ALLERGIES: Your seasonal allergies are being managed with Flonase  and Zyrtec, which have helped with your congestion and voice changes. However, your voice weakness still occurs occasionally. Continue with your current medications.  -OSTEOARTHRITIS: Your severe osteoarthritis, especially in your knees, is causing significant pain and mobility issues. Although you are hesitant about knee replacement surgery, strengthening exercises for your thigh muscles may help improve your mobility. Physical therapy increased your knee pain, so we will focus on exercises that you can do at home.  -HYPERTENSION: Your hypertension is well-managed with your current treatment. No changes were discussed during this visit.  -HYPERLIPIDEMIA: Your hyperlipidemia is well-managed with your current treatment. No changes were discussed during this visit.  INSTRUCTIONS:  Please follow up with the chest x-ray and lung function test as ordered. Continue taking your medications for seasonal allergies, hypertension, and hyperlipidemia as prescribed. Try to incorporate strengthening exercises for your thigh muscles to help with your osteoarthritis. If your shortness of breath persists, we may consider an inhaler trial. Schedule a follow-up appointment to review the results of your tests and discuss any  further steps.

## 2024-04-28 NOTE — Progress Notes (Signed)
 Carolyn Yang    [id number]    1941-06-03  Primary Care Physician:Veludandi, Selina Dale, MD  Referring Physician: Tye Gall, MD [address] [place],  [place] [address]  Chief complaint: Consult for dyspnea  HPI: 83 y.o. who  has a past medical history of Hyperlipidemia, Hypertension, Migraine, and Osteoarthritis of both knees.  Discussed the use of AI scribe software for clinical note transcription with the patient, who gave verbal consent to proceed.  History of Present Illness Carolyn Yang is an 83 year old female with hypertension and hyperlipidemia who presents with shortness of breath. She is accompanied by her daughter, Jan.  She has been experiencing intermittent shortness of breath since August 2021, initially noticed after her father's death. The episodes are often related to anxiety. Despite treatment with nebulizers, inhalers, and a device to blow into, she continues to experience episodes of coughing with small amounts of yellow phlegm.  She has a history of congestion and a persistent cough, which led to a chest x-ray that was clear. Her voice has been weak at times, and she takes Zyrtec, which improved her symptoms, although her voice remains weak occasionally. No history of smoking, acid reflux, or other lung issues such as asthma or COPD. She experiences dryness in her throat and mouth, requiring frequent water intake.  Her past medical history includes hypertension, hyperlipidemia, and osteoarthritis. She has been very active until about two years ago when she began experiencing significant weakness. She has seen a cardiologist, and her heart ultrasound and carotid artery tests were normal, with only age-related changes noted. She has seasonal allergies and is currently taking Flonase  and Zyrtec.  She recently completed a course of Macrobid  for a urinary tract infection but denies long-term exposure. She denies any family history of lung disease,  although her father had arteriosclerosis and strokes. She has not been exposed to any environmental factors at home that could affect her lungs, such as mold or feather pillows.  Her osteoarthritis, particularly in her knees, has significantly impacted her mobility, making it difficult to stand or walk. 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    Pets: No pets Occupation: History Runner, broadcasting/film/video in high school.  Now retired Exposures: No mold, hot tub, Jacuzzi.  No feather pillows or comforter Smoking history: Never smoker Travel history: No significant travel history Relevant family history: No family history of lung disease   Outpatient Encounter Medications as of 04/28/2024  Medication Sig   alendronate (FOSAMAX) 70 MG tablet Take 70 mg by mouth once a week.   diclofenac Sodium (VOLTAREN) 1 % GEL Apply 4 g topically 4 (four) times daily.   fluticasone  (FLONASE ) 50 MCG/ACT nasal spray Place 2 sprays into both nostrils daily.   mirtazapine  (REMERON ) 15 MG tablet Take 1 tablet (15 mg total) by mouth at bedtime.   Multiple Vitamins-Minerals (CENTRUM SILVER 50+WOMEN) TABS Take 1 tablet by mouth daily at 6 (six) AM.   propranolol  (INDERAL ) 20 MG tablet Take 0.5 tablets (10 mg total) by mouth daily.   rosuvastatin  (CRESTOR ) 5 MG tablet Take 1 tablet (5 mg total) by mouth at bedtime.   sertraline  (ZOLOFT ) 100 MG tablet Take 1.5 tablets (150 mg total) by mouth every morning.   [EXPIRED] nitrofurantoin , macrocrystal-monohydrate, (MACROBID ) 100 MG capsule Take 1 capsule (100 mg total) by mouth 2 (two) times daily for 7 days.   No facility-administered encounter medications on file as  of 04/28/2024.    Allergies as of 04/28/2024 - Review Complete 04/28/2024  Allergen Reaction Noted   Amoxicillin-pot clavulanate Itching and Rash 10/06/2015   Shrimp extract Nausea And Vomiting 11/21/2017   Codeine Nausea And Vomiting and Nausea Only 10/06/2015     Past Medical History:  Diagnosis Date   Hyperlipidemia    Hypertension    Migraine    Osteoarthritis of both knees     Past Surgical History:  Procedure Laterality Date   ABSCESS DRAINAGE  2018   Intracranial empyema/masticator space abscess   BIOPSY BREAST     CESAREAN SECTION     CHOLECYSTECTOMY     DG GALL BLADDER  2000   LEFT OOPHORECTOMY     pelvis and verterbrae  2022   VAGINAL HYSTERECTOMY  1979    Family History  Problem Relation Age of Onset   Heart failure Mother    Arthritis-Osteo Mother    Stroke Father    Heart attack Father    Stroke Brother     Social History   Socioeconomic History   Marital status: Widowed    Spouse name: Not on file   Number of children: Not on file   Years of education: Not on file   Highest education level: Bachelor's degree (e.g., BA, AB, BS)  Occupational History   Not on file  Tobacco Use   Smoking status: Never   Smokeless tobacco: Never  Vaping Use   Vaping status: Never Used  Substance and Sexual Activity   Alcohol  use: Not Currently   Drug use: Not Currently   Sexual activity: Not Currently  Other Topics Concern   Not on file  Social History Narrative   Diet:      Caffeine:yes       Married, if yes what year:       Do you live in a house, apartment, assisted living, condo, trailer, ect: townhouse      Is it one or more stories: 1      How many persons live in your home?2      Pets:0      Highest level or education completed: BA       Current/Past profession:Teacher      Exercise:                  Type and how often:          Living Will:yes   DNR:   POA/HPOA:yes      Functional Status:   Do you have difficulty bathing or dressing yourself?no   Do you have difficulty preparing food or eating?no   Do you have difficulty managing your medications?no   Do you have difficulty managing your finances?no    Do you have difficulty affording your medications? no   Social Drivers of Manufacturing engineer Strain: Not on file  Food Insecurity: Not on file  Transportation Needs: Not on file  Physical Activity: Not on file  Stress: Not on file  Social Connections: Not on file  Intimate Partner Violence: Not on file    Review of systems: Review of Systems  Constitutional: Negative for fever and chills.  HENT: Negative.   Eyes: Negative for blurred vision.  Respiratory: as per HPI  Cardiovascular: Negative for chest pain and palpitations.  Gastrointestinal: Negative for vomiting, diarrhea, blood per rectum. Genitourinary: Negative for dysuria, urgency, frequency and hematuria.  Musculoskeletal: Negative for myalgias, back pain and joint pain.  Skin: Negative for itching  and rash.  Neurological: Negative for dizziness, tremors, focal weakness, seizures and loss of consciousness.  Endo/Heme/Allergies: Negative for environmental allergies.  Psychiatric/Behavioral: Negative for depression, suicidal ideas and hallucinations.  All other systems reviewed and are negative.  Physical Exam: Blood pressure (!) 143/82, pulse 81, height 5\' 1"  (1.549 m), weight 173 lb 12.8 oz (78.8 kg), SpO2 93%. Gen:      No acute distress HEENT:  EOMI, sclera anicteric Neck:     No masses; no thyromegaly Lungs:    Clear to auscultation bilaterally; normal respiratory effort CV:         Regular rate and rhythm; no murmurs Abd:      + bowel sounds; soft, non-tender; no palpable masses, no distension Ext:    No edema; adequate peripheral perfusion Skin:      Warm and dry; no rash Neuro: alert and oriented x 3 Psych: normal mood and affect  Data Reviewed: Imaging: Chest x-ray 07/29/2023-no active cardiopulmonary disease. I have reviewed the images personally  PFTs:  Labs:  Assessment & Plan Shortness of breath Chronic shortness of breath since August 2021, with episodes of coughing and yellow phlegm. Previous chest x-ray in August 2024 was clear. Differential includes anxiety,  deconditioning, or undiagnosed pulmonary condition. No history of smoking or significant environmental exposures. Cardiac evaluation was unremarkable. Lungs are clear on examination today. Discussed potential deconditioning due to decreased activity levels and knee pain.  - Order chest x-ray - Schedule lung function test - Consider inhaler trial if symptoms persist but they would like to avoid additional medications if possible  Seasonal allergies Seasonal allergies managed with Flonase  and Zyrtec. Symptoms include voice changes and congestion, which have improved with current treatment. Voice weakness persists intermittently.  Osteoarthritis Severe osteoarthritis affecting knees, causing significant pain and mobility issues. She is hesitant about knee replacement surgery due to age and other health concerns. Physical therapy increased knee pain. Discussed potential benefits of strengthening exercises for thigh muscles to improve mobility.   Recommendations: X-ray today PFTs on follow-up visit  Phyllis Breeze MD [facility] 04/28/2024, 3:14 PM  CC: Tye Gall, *

## 2024-05-04 ENCOUNTER — Encounter: Payer: Self-pay | Admitting: Pulmonary Disease

## 2024-05-12 ENCOUNTER — Encounter: Payer: Self-pay | Admitting: Sports Medicine

## 2024-05-12 ENCOUNTER — Ambulatory Visit: Payer: Medicare PPO | Admitting: Sports Medicine

## 2024-05-12 VITALS — BP 110/82 | HR 75 | Temp 97.0°F | Resp 19 | Ht 61.0 in | Wt 178.2 lb

## 2024-05-12 DIAGNOSIS — W19XXXA Unspecified fall, initial encounter: Secondary | ICD-10-CM | POA: Diagnosis not present

## 2024-05-12 DIAGNOSIS — J3489 Other specified disorders of nose and nasal sinuses: Secondary | ICD-10-CM

## 2024-05-12 DIAGNOSIS — F411 Generalized anxiety disorder: Secondary | ICD-10-CM | POA: Diagnosis not present

## 2024-05-12 DIAGNOSIS — R0781 Pleurodynia: Secondary | ICD-10-CM

## 2024-05-12 DIAGNOSIS — J40 Bronchitis, not specified as acute or chronic: Secondary | ICD-10-CM | POA: Diagnosis not present

## 2024-05-12 MED ORDER — BACLOFEN 5 MG PO TABS: 5.0000 mg | ORAL_TABLET | Freq: Every day | ORAL | 0 refills | Status: DC

## 2024-05-12 MED ORDER — BUSPIRONE HCL 5 MG PO TABS: 5.0000 mg | ORAL_TABLET | Freq: Two times a day (BID) | ORAL | 1 refills | Status: DC

## 2024-05-12 MED ORDER — ACETAMINOPHEN 500 MG PO TABS: 1000.0000 mg | ORAL_TABLET | Freq: Three times a day (TID) | ORAL | 0 refills | Status: DC | PRN

## 2024-05-12 NOTE — Patient Instructions (Signed)
 Take tylenol 1000mg  tid  Use baclofen 5 m at bed time  Use lidocaine patch    Take buspar 5 mg twice daily for anxiety

## 2024-05-12 NOTE — Progress Notes (Signed)
 Careteam: Patient Care Team: Tye Gall, MD as PCP - General (Internal Medicine) Croitoru, Karyl Paget, MD as PCP - Cardiology (Cardiology)  PLACE OF SERVICE:  [facility]  Advanced Directive information    Allergies  Allergen Reactions   Amoxicillin-Pot Clavulanate Itching and Rash   Shrimp Extract Nausea And Vomiting   Codeine Nausea And Vomiting and Nausea Only    Chief Complaint  Patient presents with   Medical Management of Chronic Issues    3 months      Discussed the use of AI scribe software for clinical note transcription with the patient, who gave verbal consent to proceed.  History of Present Illness    Carolyn Yang is an 83 year old female who presents with pain and bruising following a fall.  She experienced a fall last Friday night while attempting to get into bed in the dark, resulting in a head injury with a knot on the back of her head and bruising on her back and nose. No excruciating headaches, nausea, vomiting, dizziness, or vision changes have occurred since the fall, but she developed a mild headache this morning.  She reports significant pain in her back and left shoulder, rated as an 'eight' on a pain scale. The pain worsens with movement, particularly when lifting her arm, and radiates to her shoulder. She is currently taking Tylenol for arthritis and regular Tylenol, as well as Advil, but these medications are not providing relief.  She also reports pain in her knees, with the right knee being worse, and notes bruising on both knees. The pain in her knees has worsened since the fall.  She has a history of anxiety and depression, for which she is taking sertraline  (Zoloft ) and Remeron . Her anxiety symptoms, including heavy breathing, have returned, although they had previously improved. She is currently taking 1.5 tablets of sertraline  and 15 mg of Remeron .  She mentions a persistent cough with phlegm but denies any changes in speech, swallowing  difficulties, or chest pain. She was recently evaluated   by  a pulmonologist who ordered a lung function test and an x-ray, although no significant findings were reported.  She lives with her son, who works long hours, leaving her alone during the day.  Review of Systems:  Review of Systems  Constitutional:  Negative for chills and fever.  HENT:  Negative for congestion and sore throat.   Eyes:  Negative for double vision.  Respiratory:  Positive for cough. Negative for sputum production and shortness of breath.   Cardiovascular:  Negative for chest pain, palpitations and leg swelling.  Gastrointestinal:  Negative for abdominal pain, heartburn and nausea.  Genitourinary:  Negative for dysuria, frequency and hematuria.  Musculoskeletal:  Positive for back pain, falls and joint pain. Negative for myalgias.  Neurological:  Negative for dizziness, sensory change and focal weakness.   Negative unless indicated in HPI.   Past Medical History:  Diagnosis Date   Hyperlipidemia    Hypertension    Migraine    Osteoarthritis of both knees    Past Surgical History:  Procedure Laterality Date   ABSCESS DRAINAGE  2018   Intracranial empyema/masticator space abscess   BIOPSY BREAST     CESAREAN SECTION     CHOLECYSTECTOMY     DG GALL BLADDER  2000   LEFT OOPHORECTOMY     pelvis and verterbrae  2022   VAGINAL HYSTERECTOMY  1979   Social History:   reports that she has never smoked. She has never used smokeless  tobacco. She reports that she does not currently use alcohol . She reports that she does not currently use drugs.  Family History  Problem Relation Age of Onset   Heart failure Mother    Arthritis-Osteo Mother    Stroke Father    Heart attack Father    Stroke Brother     Medications: Patient's Medications  New Prescriptions   No medications on file  Previous Medications   ALENDRONATE (FOSAMAX) 70 MG TABLET    Take 70 mg by mouth once a week.   DICLOFENAC SODIUM (VOLTAREN)  1 % GEL    Apply 4 g topically 4 (four) times daily.   FLUTICASONE  (FLONASE ) 50 MCG/ACT NASAL SPRAY    Place 2 sprays into both nostrils daily.   MIRTAZAPINE  (REMERON ) 15 MG TABLET    Take 1 tablet (15 mg total) by mouth at bedtime.   MULTIPLE VITAMINS-MINERALS (CENTRUM SILVER 50+WOMEN) TABS    Take 1 tablet by mouth daily at 6 (six) AM.   PROPRANOLOL  (INDERAL ) 20 MG TABLET    Take 0.5 tablets (10 mg total) by mouth daily.   ROSUVASTATIN  (CRESTOR ) 5 MG TABLET    Take 1 tablet (5 mg total) by mouth at bedtime.   SERTRALINE  (ZOLOFT ) 100 MG TABLET    Take 1.5 tablets (150 mg total) by mouth every morning.  Modified Medications   No medications on file  Discontinued Medications   No medications on file    Physical Exam: Vitals:   05/12/24 1501  BP: 110/82  Pulse: 75  Resp: 19  Temp: (!) 97 F (36.1 C)  SpO2: 99%  Weight: 178 lb 3.2 oz (80.8 kg)  Height: 5\' 1"  (1.549 m)   Body mass index is 33.67 kg/m. BP Readings from Last 3 Encounters:  05/12/24 110/82  04/28/24 (!) 143/82  04/20/24 126/84   Wt Readings from Last 3 Encounters:  05/12/24 178 lb 3.2 oz (80.8 kg)  04/28/24 173 lb 12.8 oz (78.8 kg)  04/20/24 173 lb 6.4 oz (78.7 kg)    Physical Exam Constitutional:      Appearance: Normal appearance.  HENT:     Head: Normocephalic and atraumatic.  Cardiovascular:     Rate and Rhythm: Normal rate and regular rhythm.  Pulmonary:     Effort: Pulmonary effort is normal. No respiratory distress.     Breath sounds: Normal breath sounds. No wheezing.  Abdominal:     General: Bowel sounds are normal. There is no distension.     Tenderness: There is no abdominal tenderness. There is no guarding or rebound.     Comments:    Musculoskeletal:        General: No swelling or tenderness.     Comments: Left lower rib pain - no bruising  Mild tenderness  Neurological:     Mental Status: She is alert. Mental status is at baseline.     Motor: No weakness.     Comments: Finger nose  neg No nystagmus Eomi Strength and sensations intact     Labs reviewed: Basic Metabolic Panel: No results for input(s): "NA", "K", "CL", "CO2", "GLUCOSE", "BUN", "CREATININE", "CALCIUM ", "MG", "PHOS", "TSH" in the last 8760 hours. Liver Function Tests: No results for input(s): "AST", "ALT", "ALKPHOS", "BILITOT", "PROT", "ALBUMIN" in the last 8760 hours. No results for input(s): "LIPASE", "AMYLASE" in the last 8760 hours. No results for input(s): "AMMONIA" in the last 8760 hours. CBC: No results for input(s): "WBC", "NEUTROABS", "HGB", "HCT", "MCV", "PLT" in the last 8760 hours. Lipid  Panel: No results for input(s): "CHOL", "HDL", "LDLCALC", "TRIG", "CHOLHDL", "LDLDIRECT" in the last 8760 hours. TSH: No results for input(s): "TSH" in the last 8760 hours. A1C: No results found for: "HGBA1C"  Assessment and Plan Assessment & Plan  1. GAD (generalized anxiety disorder) (Primary) Will add buspar  Cont with zoloft , rameron  2. Rib pain on left side Will start baclofen  - DG Ribs Unilateral Left; Future  3. Nasal pain  - DG Nasal Bones; Future  4. Fall, initial encounter  - DG Nasal Bones; Future  Other orders - busPIRone (BUSPAR) 5 MG tablet; Take 1 tablet (5 mg total) by mouth 2 (two) times daily.  Dispense: 180 tablet; Refill: 1 - Baclofen 5 MG TABS; Take 1 tablet (5 mg total) by mouth at bedtime.  Dispense: 10 tablet; Refill: 0 - acetaminophen (TYLENOL) 500 MG tablet; Take 2 tablets (1,000 mg total) by mouth every 8 (eight) hours as needed.  Dispense: 90 tablet; Refill: 0   40 min  Total time spent for obtaining history,  performing a medically appropriate examination and evaluation, reviewing the tests,ordering  tests,  documenting clinical information in the electronic or other health record,   ,care coordination (not separately reported)

## 2024-05-15 ENCOUNTER — Encounter: Payer: Self-pay | Admitting: Sports Medicine

## 2024-05-18 ENCOUNTER — Encounter: Payer: Self-pay | Admitting: Cardiovascular Disease

## 2024-05-19 ENCOUNTER — Ambulatory Visit: Payer: Medicare PPO | Admitting: Cardiovascular Disease

## 2024-05-20 ENCOUNTER — Ambulatory Visit
Admission: RE | Admit: 2024-05-20 | Discharge: 2024-05-20 | Disposition: A | Discharge status: Home / Self Care | Source: Ambulatory Visit | Attending: Sports Medicine | Admitting: Sports Medicine

## 2024-05-20 DIAGNOSIS — R0781 Pleurodynia: Secondary | ICD-10-CM | POA: Diagnosis not present

## 2024-05-20 DIAGNOSIS — S2242XA Multiple fractures of ribs, left side, initial encounter for closed fracture: Secondary | ICD-10-CM | POA: Diagnosis not present

## 2024-05-20 DIAGNOSIS — W19XXXA Unspecified fall, initial encounter: Secondary | ICD-10-CM

## 2024-05-20 DIAGNOSIS — J3489 Other specified disorders of nose and nasal sinuses: Secondary | ICD-10-CM | POA: Diagnosis not present

## 2024-05-21 ENCOUNTER — Telehealth: Payer: Self-pay

## 2024-05-21 NOTE — Telephone Encounter (Unsigned)
 Copied from CRM [id number]. Topic: General - Other >> May 21, 2024  1:50 PM Retta Caster wrote: Reason for CRM: DG Nasal Bones (Accession [id number]) (Order [id number]) DG Ribs Unilateral Left (Accession [id number]) (Order [id number]) Patient and friend Jena Minor needs call back on Imaging result done 05/28. [phone]

## 2024-05-22 NOTE — Telephone Encounter (Signed)
 Plz call the patient and inform that x ray results are still pending

## 2024-05-22 NOTE — Telephone Encounter (Signed)
 Called patient and informed her results are still pending patient understands  and knows we will call her after we get the results

## 2024-05-24 ENCOUNTER — Encounter: Payer: Self-pay | Admitting: Sports Medicine

## 2024-05-25 NOTE — Telephone Encounter (Signed)
Message routed to PCP Venita Sheffield, MD

## 2024-05-25 NOTE — Telephone Encounter (Signed)
 Message routed to Admin staff to schedule patient appointment to see PCP Tye Gall, MD

## 2024-05-26 ENCOUNTER — Ambulatory Visit (INDEPENDENT_AMBULATORY_CARE_PROVIDER_SITE_OTHER): Admitting: Sports Medicine

## 2024-05-26 ENCOUNTER — Encounter: Payer: Self-pay | Admitting: Sports Medicine

## 2024-05-26 VITALS — BP 120/78 | HR 78 | Temp 97.3°F | Resp 17 | Ht 61.0 in

## 2024-05-26 DIAGNOSIS — R350 Frequency of micturition: Secondary | ICD-10-CM | POA: Diagnosis not present

## 2024-05-26 DIAGNOSIS — M545 Low back pain, unspecified: Secondary | ICD-10-CM | POA: Diagnosis not present

## 2024-05-26 DIAGNOSIS — G8929 Other chronic pain: Secondary | ICD-10-CM

## 2024-05-26 DIAGNOSIS — R2689 Other abnormalities of gait and mobility: Secondary | ICD-10-CM

## 2024-05-26 DIAGNOSIS — R296 Repeated falls: Secondary | ICD-10-CM

## 2024-05-26 LAB — POCT URINALYSIS DIPSTICK
Bilirubin, UA: NEGATIVE
Glucose, UA: NEGATIVE
Ketones, UA: POSITIVE
Nitrite, UA: NEGATIVE
Protein, UA: NEGATIVE
Spec Grav, UA: 1.015 (ref 1.010–1.025)
Urobilinogen, UA: NEGATIVE U/dL — AB
pH, UA: 6 (ref 5.0–8.0)

## 2024-05-26 MED ORDER — CIPROFLOXACIN HCL 500 MG PO TABS: 500.0000 mg | ORAL_TABLET | Freq: Two times a day (BID) | ORAL | 0 refills | Status: AC

## 2024-05-26 MED ORDER — GUAIFENESIN ER 600 MG PO TB12: 1200.0000 mg | ORAL_TABLET | Freq: Two times a day (BID) | ORAL | 0 refills | Status: DC

## 2024-05-26 MED ORDER — BACLOFEN 5 MG PO TABS: 5.0000 mg | ORAL_TABLET | Freq: Every day | ORAL | 0 refills | Status: DC

## 2024-05-26 NOTE — Progress Notes (Signed)
 Careteam: Patient Care Team: Tye Gall, MD as PCP - General (Internal Medicine) Croitoru, Karyl Paget, MD as PCP - Cardiology (Cardiology)  PLACE OF SERVICE:  [facility]  Advanced Directive information    Allergies  Allergen Reactions   Amoxicillin-Pot Clavulanate Itching and Rash   Shrimp Extract Nausea And Vomiting   Codeine Nausea And Vomiting and Nausea Only    Chief Complaint  Patient presents with   Urinary Frequency    Trouble controlling bladder       Discussed the use of AI scribe software for clinical note transcription with the patient, who gave verbal consent to proceed.  History of Present Illness Carolyn Yang is an 83 year old female who presents with worsening back pain and urinary incontinence following a fall.  She has been experiencing persistent and worsening back pain since a fall over her walker. The pain is constant, located under her left ribs, and radiates to the mid-back and spine area. Movement, such as raising her arm, lifting objects, and sleeping, exacerbates the pain. No recent falls have occurred after the initial incident. X-rays were performed last week, but results are pending.  She reports worsening knee pain, particularly along the shin bone, which has been exacerbated by previous physical therapy sessions. She has fallen five times, with the most recent fall being the worst, leading to her current condition. She expresses difficulty in performing daily activities such as lifting and standing for extended periods.  She is experiencing new onset urinary incontinence, characterized by an inability to hold her bladder. No burning sensation during urination, lower abdominal pain, or blood in the urine is noted.  She has been coughing up yellow phlegm frequently and notes a change in her voice, which she describes as 'rattling'. No heartburn, acid reflux, or post-nasal drip is reported.  Her current medications include a muscle relaxant,  baclofen , which she takes at night and reports it helps her sleep. She recently started Buspar  for anxiety, which she acknowledges but is unsure of its effects yet.     Review of Systems:  Review of Systems  Constitutional:  Negative for chills and fever.  Eyes:  Negative for double vision.  Respiratory:  Negative for cough, sputum production and shortness of breath.   Cardiovascular:  Negative for chest pain, palpitations and leg swelling.  Gastrointestinal:  Negative for abdominal pain, heartburn and nausea.  Genitourinary:  Positive for frequency. Negative for dysuria and hematuria.  Musculoskeletal:  Positive for falls and myalgias.  Neurological:  Negative for dizziness, sensory change and focal weakness.   Negative unless indicated in HPI.   Past Medical History:  Diagnosis Date   Hyperlipidemia    Hypertension    Migraine    Osteoarthritis of both knees    Past Surgical History:  Procedure Laterality Date   ABSCESS DRAINAGE  2018   Intracranial empyema/masticator space abscess   BIOPSY BREAST     CESAREAN SECTION     CHOLECYSTECTOMY     DG GALL BLADDER  2000   LEFT OOPHORECTOMY     pelvis and verterbrae  2022   VAGINAL HYSTERECTOMY  1979   Social History:   reports that she has never smoked. She has never used smokeless tobacco. She reports that she does not currently use alcohol . She reports that she does not currently use drugs.  Family History  Problem Relation Age of Onset   Heart failure Mother    Arthritis-Osteo Mother    Stroke Father    Heart  attack Father    Stroke Brother     Medications: Patient's Medications  New Prescriptions   No medications on file  Previous Medications   ACETAMINOPHEN  (TYLENOL ) 500 MG TABLET    Take 2 tablets (1,000 mg total) by mouth every 8 (eight) hours as needed.   ALENDRONATE (FOSAMAX) 70 MG TABLET    Take 70 mg by mouth once a week.   BACLOFEN  5 MG TABS    Take 1 tablet (5 mg total) by mouth at bedtime.   BUSPIRONE   (BUSPAR ) 5 MG TABLET    Take 1 tablet (5 mg total) by mouth 2 (two) times daily.   DICLOFENAC SODIUM (VOLTAREN) 1 % GEL    Apply 4 g topically 4 (four) times daily.   FLUTICASONE  (FLONASE ) 50 MCG/ACT NASAL SPRAY    Place 2 sprays into both nostrils daily.   MIRTAZAPINE  (REMERON ) 15 MG TABLET    Take 1 tablet (15 mg total) by mouth at bedtime.   MULTIPLE VITAMINS-MINERALS (CENTRUM SILVER 50+WOMEN) TABS    Take 1 tablet by mouth daily at 6 (six) AM.   PROPRANOLOL  (INDERAL ) 20 MG TABLET    Take 0.5 tablets (10 mg total) by mouth daily.   ROSUVASTATIN  (CRESTOR ) 5 MG TABLET    Take 1 tablet (5 mg total) by mouth at bedtime.   SERTRALINE  (ZOLOFT ) 100 MG TABLET    Take 1.5 tablets (150 mg total) by mouth every morning.  Modified Medications   No medications on file  Discontinued Medications   No medications on file    Physical Exam: Vitals:   05/26/24 1357  Height: 5\' 1"  (1.549 m)   Body mass index is 33.67 kg/m. BP Readings from Last 3 Encounters:  05/12/24 110/82  04/28/24 (!) 143/82  04/20/24 126/84   Wt Readings from Last 3 Encounters:  05/12/24 178 lb 3.2 oz (80.8 kg)  04/28/24 173 lb 12.8 oz (78.8 kg)  04/20/24 173 lb 6.4 oz (78.7 kg)    Physical Exam Constitutional:      Appearance: Normal appearance.  HENT:     Head: Normocephalic and atraumatic.  Cardiovascular:     Rate and Rhythm: Normal rate and regular rhythm.  Pulmonary:     Effort: Pulmonary effort is normal. No respiratory distress.     Breath sounds: Normal breath sounds. No wheezing.  Abdominal:     General: Bowel sounds are normal. There is no distension.     Tenderness: There is no abdominal tenderness. There is no guarding or rebound.     Comments:    Musculoskeletal:        General: No swelling.     Comments: Bruises under Left rib area Lumbar curvature    Neurological:     Mental Status: She is alert. Mental status is at baseline.     Motor: No weakness.     Labs reviewed: Basic Metabolic  Panel: No results for input(s): "NA", "K", "CL", "CO2", "GLUCOSE", "BUN", "CREATININE", "CALCIUM ", "MG", "PHOS", "TSH" in the last 8760 hours. Liver Function Tests: No results for input(s): "AST", "ALT", "ALKPHOS", "BILITOT", "PROT", "ALBUMIN" in the last 8760 hours. No results for input(s): "LIPASE", "AMYLASE" in the last 8760 hours. No results for input(s): "AMMONIA" in the last 8760 hours. CBC: No results for input(s): "WBC", "NEUTROABS", "HGB", "HCT", "MCV", "PLT" in the last 8760 hours. Lipid Panel: No results for input(s): "CHOL", "HDL", "LDLCALC", "TRIG", "CHOLHDL", "LDLDIRECT" in the last 8760 hours. TSH: No results for input(s): "TSH" in the last 8760 hours.  A1C: No results found for: "HGBA1C"  Assessment and Plan Assessment & Plan   1. Frequent falls (Primary)   - Ambulatory referral to Home Health  2. Balance problems   - Ambulatory referral to Home Health  3. Chronic bilateral low back pain without sciatica  Refilled buspar   Instructed to take tylenol  650 mg q6 prn for pain Use lidocaine patch - Ambulatory referral to Home Health  4. Urinary frequency  Urine dipstick positive Will send cipro to her pharmacy - Culture, Urine  Other orders - guaiFENesin (MUCINEX) 600 MG 12 hr tablet; Take 2 tablets (1,200 mg total) by mouth 2 (two) times daily.  Dispense: 30 tablet; Refill: 0 - Baclofen  5 MG TABS; Take 1 tablet (5 mg total) by mouth at bedtime.  Dispense: 10 tablet; Refill: 0 - ciprofloxacin (CIPRO) 500 MG tablet; Take 1 tablet (500 mg total) by mouth 2 (two) times daily for 5 days.  Dispense: 10 tablet; Refill: 0

## 2024-05-28 LAB — URINE CULTURE
MICRO NUMBER:: 16533333
SPECIMEN QUALITY:: ADEQUATE

## 2024-05-29 ENCOUNTER — Ambulatory Visit: Payer: Self-pay | Admitting: Sports Medicine

## 2024-06-01 ENCOUNTER — Telehealth: Payer: Self-pay

## 2024-06-01 DIAGNOSIS — S2242XD Multiple fractures of ribs, left side, subsequent encounter for fracture with routine healing: Secondary | ICD-10-CM | POA: Diagnosis not present

## 2024-06-01 DIAGNOSIS — M170 Bilateral primary osteoarthritis of knee: Secondary | ICD-10-CM | POA: Diagnosis not present

## 2024-06-01 DIAGNOSIS — C44519 Basal cell carcinoma of skin of other part of trunk: Secondary | ICD-10-CM | POA: Diagnosis not present

## 2024-06-01 DIAGNOSIS — F419 Anxiety disorder, unspecified: Secondary | ICD-10-CM | POA: Diagnosis not present

## 2024-06-01 DIAGNOSIS — G8929 Other chronic pain: Secondary | ICD-10-CM | POA: Diagnosis not present

## 2024-06-01 DIAGNOSIS — I10 Essential (primary) hypertension: Secondary | ICD-10-CM | POA: Diagnosis not present

## 2024-06-01 DIAGNOSIS — M545 Low back pain, unspecified: Secondary | ICD-10-CM | POA: Diagnosis not present

## 2024-06-01 DIAGNOSIS — Z9181 History of falling: Secondary | ICD-10-CM | POA: Diagnosis not present

## 2024-06-01 NOTE — Telephone Encounter (Signed)
 Spoke with Ligi and gave authorization for verbal orders (see below) per standing protocol. I also gave her the diagnosis code for rib fracture (S22.39XA) as Ligi stated that is a better supported code for the services that they will provider for patient.  PT orders: Gait, strength, and balancing, 2 x 3 weeks, then 1 x 3 weeks

## 2024-06-01 NOTE — Telephone Encounter (Signed)
 Mychart message sent to patient and it appears she reviewed results online. Outgoing call placed to patient as well, no answer and I left a message recommending that she see Dr.Veludandi's response to xray report and if she has any specific questions to call or send them via mychart

## 2024-06-01 NOTE — Telephone Encounter (Signed)
 Copied from CRM [id number]. Topic: General - Other >> May 29, 2024  5:56 PM Adrianna P wrote: Reason for CRM: Patient called to receive x-ray results. Please call [phone]

## 2024-06-01 NOTE — Telephone Encounter (Signed)
 Copied from CRM [id number]. Topic: Clinical - Home Health Verbal Orders >> Jun 01, 2024  2:54 PM Carrielelia G wrote: Caller/Agency: Liji with [facility]  Callback Number: [phone] Service Requested: Physical Therapy Frequency: 2w3 and 1w3  Any new concerns about the patient? Yes ,,, Fracture of left ribs. >> Jun 01, 2024  3:01 PM Carrielelia G wrote: [facility] nurse to [facility] in the office

## 2024-06-01 NOTE — Telephone Encounter (Signed)
Patient has additional questions.

## 2024-06-01 NOTE — Telephone Encounter (Signed)
Please answer patients question

## 2024-06-04 DIAGNOSIS — M170 Bilateral primary osteoarthritis of knee: Secondary | ICD-10-CM | POA: Diagnosis not present

## 2024-06-04 DIAGNOSIS — S2242XD Multiple fractures of ribs, left side, subsequent encounter for fracture with routine healing: Secondary | ICD-10-CM | POA: Diagnosis not present

## 2024-06-04 DIAGNOSIS — C44519 Basal cell carcinoma of skin of other part of trunk: Secondary | ICD-10-CM | POA: Diagnosis not present

## 2024-06-04 DIAGNOSIS — F419 Anxiety disorder, unspecified: Secondary | ICD-10-CM | POA: Diagnosis not present

## 2024-06-04 DIAGNOSIS — G8929 Other chronic pain: Secondary | ICD-10-CM | POA: Diagnosis not present

## 2024-06-04 DIAGNOSIS — I10 Essential (primary) hypertension: Secondary | ICD-10-CM | POA: Diagnosis not present

## 2024-06-04 DIAGNOSIS — M545 Low back pain, unspecified: Secondary | ICD-10-CM | POA: Diagnosis not present

## 2024-06-04 DIAGNOSIS — Z9181 History of falling: Secondary | ICD-10-CM | POA: Diagnosis not present

## 2024-06-05 DIAGNOSIS — F419 Anxiety disorder, unspecified: Secondary | ICD-10-CM | POA: Diagnosis not present

## 2024-06-05 DIAGNOSIS — S2242XD Multiple fractures of ribs, left side, subsequent encounter for fracture with routine healing: Secondary | ICD-10-CM | POA: Diagnosis not present

## 2024-06-05 DIAGNOSIS — Z9181 History of falling: Secondary | ICD-10-CM | POA: Diagnosis not present

## 2024-06-05 DIAGNOSIS — M545 Low back pain, unspecified: Secondary | ICD-10-CM | POA: Diagnosis not present

## 2024-06-05 DIAGNOSIS — I10 Essential (primary) hypertension: Secondary | ICD-10-CM | POA: Diagnosis not present

## 2024-06-05 DIAGNOSIS — C44519 Basal cell carcinoma of skin of other part of trunk: Secondary | ICD-10-CM | POA: Diagnosis not present

## 2024-06-05 DIAGNOSIS — M170 Bilateral primary osteoarthritis of knee: Secondary | ICD-10-CM | POA: Diagnosis not present

## 2024-06-05 DIAGNOSIS — G8929 Other chronic pain: Secondary | ICD-10-CM | POA: Diagnosis not present

## 2024-06-08 DIAGNOSIS — F419 Anxiety disorder, unspecified: Secondary | ICD-10-CM | POA: Diagnosis not present

## 2024-06-08 DIAGNOSIS — Z9181 History of falling: Secondary | ICD-10-CM | POA: Diagnosis not present

## 2024-06-08 DIAGNOSIS — C44519 Basal cell carcinoma of skin of other part of trunk: Secondary | ICD-10-CM | POA: Diagnosis not present

## 2024-06-08 DIAGNOSIS — S2242XD Multiple fractures of ribs, left side, subsequent encounter for fracture with routine healing: Secondary | ICD-10-CM | POA: Diagnosis not present

## 2024-06-08 DIAGNOSIS — I10 Essential (primary) hypertension: Secondary | ICD-10-CM | POA: Diagnosis not present

## 2024-06-08 DIAGNOSIS — G8929 Other chronic pain: Secondary | ICD-10-CM | POA: Diagnosis not present

## 2024-06-08 DIAGNOSIS — M545 Low back pain, unspecified: Secondary | ICD-10-CM | POA: Diagnosis not present

## 2024-06-08 DIAGNOSIS — M170 Bilateral primary osteoarthritis of knee: Secondary | ICD-10-CM | POA: Diagnosis not present

## 2024-06-09 DIAGNOSIS — F419 Anxiety disorder, unspecified: Secondary | ICD-10-CM | POA: Diagnosis not present

## 2024-06-09 DIAGNOSIS — S2242XD Multiple fractures of ribs, left side, subsequent encounter for fracture with routine healing: Secondary | ICD-10-CM | POA: Diagnosis not present

## 2024-06-09 DIAGNOSIS — M545 Low back pain, unspecified: Secondary | ICD-10-CM | POA: Diagnosis not present

## 2024-06-09 DIAGNOSIS — G8929 Other chronic pain: Secondary | ICD-10-CM | POA: Diagnosis not present

## 2024-06-09 DIAGNOSIS — Z9181 History of falling: Secondary | ICD-10-CM | POA: Diagnosis not present

## 2024-06-09 DIAGNOSIS — I10 Essential (primary) hypertension: Secondary | ICD-10-CM | POA: Diagnosis not present

## 2024-06-09 DIAGNOSIS — C44519 Basal cell carcinoma of skin of other part of trunk: Secondary | ICD-10-CM | POA: Diagnosis not present

## 2024-06-09 DIAGNOSIS — M170 Bilateral primary osteoarthritis of knee: Secondary | ICD-10-CM | POA: Diagnosis not present

## 2024-06-10 DIAGNOSIS — G8929 Other chronic pain: Secondary | ICD-10-CM | POA: Diagnosis not present

## 2024-06-10 DIAGNOSIS — I10 Essential (primary) hypertension: Secondary | ICD-10-CM | POA: Diagnosis not present

## 2024-06-10 DIAGNOSIS — C44519 Basal cell carcinoma of skin of other part of trunk: Secondary | ICD-10-CM | POA: Diagnosis not present

## 2024-06-10 DIAGNOSIS — M545 Low back pain, unspecified: Secondary | ICD-10-CM | POA: Diagnosis not present

## 2024-06-10 DIAGNOSIS — S2242XD Multiple fractures of ribs, left side, subsequent encounter for fracture with routine healing: Secondary | ICD-10-CM | POA: Diagnosis not present

## 2024-06-10 DIAGNOSIS — Z9181 History of falling: Secondary | ICD-10-CM | POA: Diagnosis not present

## 2024-06-10 DIAGNOSIS — F419 Anxiety disorder, unspecified: Secondary | ICD-10-CM | POA: Diagnosis not present

## 2024-06-10 DIAGNOSIS — M170 Bilateral primary osteoarthritis of knee: Secondary | ICD-10-CM | POA: Diagnosis not present

## 2024-06-11 DIAGNOSIS — M170 Bilateral primary osteoarthritis of knee: Secondary | ICD-10-CM | POA: Diagnosis not present

## 2024-06-11 DIAGNOSIS — G8929 Other chronic pain: Secondary | ICD-10-CM | POA: Diagnosis not present

## 2024-06-11 DIAGNOSIS — S2242XD Multiple fractures of ribs, left side, subsequent encounter for fracture with routine healing: Secondary | ICD-10-CM | POA: Diagnosis not present

## 2024-06-11 DIAGNOSIS — Z9181 History of falling: Secondary | ICD-10-CM | POA: Diagnosis not present

## 2024-06-11 DIAGNOSIS — M545 Low back pain, unspecified: Secondary | ICD-10-CM | POA: Diagnosis not present

## 2024-06-11 DIAGNOSIS — I10 Essential (primary) hypertension: Secondary | ICD-10-CM | POA: Diagnosis not present

## 2024-06-11 DIAGNOSIS — F419 Anxiety disorder, unspecified: Secondary | ICD-10-CM | POA: Diagnosis not present

## 2024-06-11 DIAGNOSIS — C44519 Basal cell carcinoma of skin of other part of trunk: Secondary | ICD-10-CM | POA: Diagnosis not present

## 2024-06-12 DIAGNOSIS — J40 Bronchitis, not specified as acute or chronic: Secondary | ICD-10-CM | POA: Diagnosis not present

## 2024-06-15 DIAGNOSIS — I10 Essential (primary) hypertension: Secondary | ICD-10-CM | POA: Diagnosis not present

## 2024-06-15 DIAGNOSIS — M545 Low back pain, unspecified: Secondary | ICD-10-CM | POA: Diagnosis not present

## 2024-06-15 DIAGNOSIS — C44519 Basal cell carcinoma of skin of other part of trunk: Secondary | ICD-10-CM | POA: Diagnosis not present

## 2024-06-15 DIAGNOSIS — Z9181 History of falling: Secondary | ICD-10-CM | POA: Diagnosis not present

## 2024-06-15 DIAGNOSIS — G8929 Other chronic pain: Secondary | ICD-10-CM | POA: Diagnosis not present

## 2024-06-15 DIAGNOSIS — S2242XD Multiple fractures of ribs, left side, subsequent encounter for fracture with routine healing: Secondary | ICD-10-CM | POA: Diagnosis not present

## 2024-06-15 DIAGNOSIS — F419 Anxiety disorder, unspecified: Secondary | ICD-10-CM | POA: Diagnosis not present

## 2024-06-15 DIAGNOSIS — M170 Bilateral primary osteoarthritis of knee: Secondary | ICD-10-CM | POA: Diagnosis not present

## 2024-06-17 DIAGNOSIS — I10 Essential (primary) hypertension: Secondary | ICD-10-CM | POA: Diagnosis not present

## 2024-06-17 DIAGNOSIS — F419 Anxiety disorder, unspecified: Secondary | ICD-10-CM | POA: Diagnosis not present

## 2024-06-17 DIAGNOSIS — C44519 Basal cell carcinoma of skin of other part of trunk: Secondary | ICD-10-CM | POA: Diagnosis not present

## 2024-06-17 DIAGNOSIS — Z9181 History of falling: Secondary | ICD-10-CM | POA: Diagnosis not present

## 2024-06-17 DIAGNOSIS — M545 Low back pain, unspecified: Secondary | ICD-10-CM | POA: Diagnosis not present

## 2024-06-17 DIAGNOSIS — S2242XD Multiple fractures of ribs, left side, subsequent encounter for fracture with routine healing: Secondary | ICD-10-CM | POA: Diagnosis not present

## 2024-06-17 DIAGNOSIS — M170 Bilateral primary osteoarthritis of knee: Secondary | ICD-10-CM | POA: Diagnosis not present

## 2024-06-17 DIAGNOSIS — G8929 Other chronic pain: Secondary | ICD-10-CM | POA: Diagnosis not present

## 2024-06-18 DIAGNOSIS — M170 Bilateral primary osteoarthritis of knee: Secondary | ICD-10-CM | POA: Diagnosis not present

## 2024-06-18 DIAGNOSIS — G8929 Other chronic pain: Secondary | ICD-10-CM | POA: Diagnosis not present

## 2024-06-18 DIAGNOSIS — M545 Low back pain, unspecified: Secondary | ICD-10-CM | POA: Diagnosis not present

## 2024-06-18 DIAGNOSIS — S2242XD Multiple fractures of ribs, left side, subsequent encounter for fracture with routine healing: Secondary | ICD-10-CM | POA: Diagnosis not present

## 2024-06-18 DIAGNOSIS — Z9181 History of falling: Secondary | ICD-10-CM | POA: Diagnosis not present

## 2024-06-18 DIAGNOSIS — I10 Essential (primary) hypertension: Secondary | ICD-10-CM | POA: Diagnosis not present

## 2024-06-18 DIAGNOSIS — F419 Anxiety disorder, unspecified: Secondary | ICD-10-CM | POA: Diagnosis not present

## 2024-06-18 DIAGNOSIS — C44519 Basal cell carcinoma of skin of other part of trunk: Secondary | ICD-10-CM | POA: Diagnosis not present

## 2024-06-23 DIAGNOSIS — G8929 Other chronic pain: Secondary | ICD-10-CM | POA: Diagnosis not present

## 2024-06-23 DIAGNOSIS — I10 Essential (primary) hypertension: Secondary | ICD-10-CM | POA: Diagnosis not present

## 2024-06-23 DIAGNOSIS — S2242XD Multiple fractures of ribs, left side, subsequent encounter for fracture with routine healing: Secondary | ICD-10-CM | POA: Diagnosis not present

## 2024-06-23 DIAGNOSIS — M545 Low back pain, unspecified: Secondary | ICD-10-CM | POA: Diagnosis not present

## 2024-06-23 DIAGNOSIS — M170 Bilateral primary osteoarthritis of knee: Secondary | ICD-10-CM | POA: Diagnosis not present

## 2024-06-23 DIAGNOSIS — C44519 Basal cell carcinoma of skin of other part of trunk: Secondary | ICD-10-CM | POA: Diagnosis not present

## 2024-06-23 DIAGNOSIS — Z9181 History of falling: Secondary | ICD-10-CM | POA: Diagnosis not present

## 2024-06-23 DIAGNOSIS — F419 Anxiety disorder, unspecified: Secondary | ICD-10-CM | POA: Diagnosis not present

## 2024-06-24 DIAGNOSIS — I10 Essential (primary) hypertension: Secondary | ICD-10-CM | POA: Diagnosis not present

## 2024-06-24 DIAGNOSIS — Z9181 History of falling: Secondary | ICD-10-CM | POA: Diagnosis not present

## 2024-06-24 DIAGNOSIS — F419 Anxiety disorder, unspecified: Secondary | ICD-10-CM | POA: Diagnosis not present

## 2024-06-24 DIAGNOSIS — G8929 Other chronic pain: Secondary | ICD-10-CM | POA: Diagnosis not present

## 2024-06-24 DIAGNOSIS — C44519 Basal cell carcinoma of skin of other part of trunk: Secondary | ICD-10-CM | POA: Diagnosis not present

## 2024-06-24 DIAGNOSIS — S2242XD Multiple fractures of ribs, left side, subsequent encounter for fracture with routine healing: Secondary | ICD-10-CM | POA: Diagnosis not present

## 2024-06-24 DIAGNOSIS — M545 Low back pain, unspecified: Secondary | ICD-10-CM | POA: Diagnosis not present

## 2024-06-24 DIAGNOSIS — M170 Bilateral primary osteoarthritis of knee: Secondary | ICD-10-CM | POA: Diagnosis not present

## 2024-07-01 DIAGNOSIS — M170 Bilateral primary osteoarthritis of knee: Secondary | ICD-10-CM | POA: Diagnosis not present

## 2024-07-01 DIAGNOSIS — S2242XD Multiple fractures of ribs, left side, subsequent encounter for fracture with routine healing: Secondary | ICD-10-CM | POA: Diagnosis not present

## 2024-07-01 DIAGNOSIS — I10 Essential (primary) hypertension: Secondary | ICD-10-CM | POA: Diagnosis not present

## 2024-07-01 DIAGNOSIS — M545 Low back pain, unspecified: Secondary | ICD-10-CM | POA: Diagnosis not present

## 2024-07-01 DIAGNOSIS — G8929 Other chronic pain: Secondary | ICD-10-CM | POA: Diagnosis not present

## 2024-07-01 DIAGNOSIS — F419 Anxiety disorder, unspecified: Secondary | ICD-10-CM | POA: Diagnosis not present

## 2024-07-01 DIAGNOSIS — C44519 Basal cell carcinoma of skin of other part of trunk: Secondary | ICD-10-CM | POA: Diagnosis not present

## 2024-07-01 DIAGNOSIS — Z9181 History of falling: Secondary | ICD-10-CM | POA: Diagnosis not present

## 2024-07-04 ENCOUNTER — Other Ambulatory Visit: Payer: Self-pay | Admitting: Sports Medicine

## 2024-07-06 NOTE — Telephone Encounter (Signed)
Pharmacy requested refill.  Pended Rx and sent to Dr. Jacquenette Shone for approval due to HIGH ALERT Warning.

## 2024-07-08 ENCOUNTER — Telehealth

## 2024-07-08 DIAGNOSIS — Z9181 History of falling: Secondary | ICD-10-CM | POA: Diagnosis not present

## 2024-07-08 DIAGNOSIS — I10 Essential (primary) hypertension: Secondary | ICD-10-CM | POA: Diagnosis not present

## 2024-07-08 DIAGNOSIS — M545 Low back pain, unspecified: Secondary | ICD-10-CM | POA: Diagnosis not present

## 2024-07-08 DIAGNOSIS — F419 Anxiety disorder, unspecified: Secondary | ICD-10-CM | POA: Diagnosis not present

## 2024-07-08 DIAGNOSIS — S2242XD Multiple fractures of ribs, left side, subsequent encounter for fracture with routine healing: Secondary | ICD-10-CM | POA: Diagnosis not present

## 2024-07-08 DIAGNOSIS — C44519 Basal cell carcinoma of skin of other part of trunk: Secondary | ICD-10-CM | POA: Diagnosis not present

## 2024-07-08 DIAGNOSIS — G8929 Other chronic pain: Secondary | ICD-10-CM | POA: Diagnosis not present

## 2024-07-08 DIAGNOSIS — M170 Bilateral primary osteoarthritis of knee: Secondary | ICD-10-CM | POA: Diagnosis not present

## 2024-07-08 NOTE — Telephone Encounter (Signed)
 Copied from CRM [id number]. Topic: Clinical - Home Health Verbal Orders >> Jul 08, 2024 10:44 AM Diannia DEL wrote: Caller/Agency: Liji/ [facility] Callback Number: [phone] Service Requested: Physical Therapy Frequency: 1 a week for 3 weeks Any new concerns about the patient? No

## 2024-07-08 NOTE — Telephone Encounter (Signed)
 Call returned to [facility] and verbal orders authorized per [employer] standing order

## 2024-07-12 DIAGNOSIS — J40 Bronchitis, not specified as acute or chronic: Secondary | ICD-10-CM | POA: Diagnosis not present

## 2024-07-15 DIAGNOSIS — G8929 Other chronic pain: Secondary | ICD-10-CM | POA: Diagnosis not present

## 2024-07-15 DIAGNOSIS — Z9181 History of falling: Secondary | ICD-10-CM | POA: Diagnosis not present

## 2024-07-15 DIAGNOSIS — S2242XD Multiple fractures of ribs, left side, subsequent encounter for fracture with routine healing: Secondary | ICD-10-CM | POA: Diagnosis not present

## 2024-07-15 DIAGNOSIS — M545 Low back pain, unspecified: Secondary | ICD-10-CM | POA: Diagnosis not present

## 2024-07-15 DIAGNOSIS — M170 Bilateral primary osteoarthritis of knee: Secondary | ICD-10-CM | POA: Diagnosis not present

## 2024-07-15 DIAGNOSIS — I10 Essential (primary) hypertension: Secondary | ICD-10-CM | POA: Diagnosis not present

## 2024-07-15 DIAGNOSIS — F419 Anxiety disorder, unspecified: Secondary | ICD-10-CM | POA: Diagnosis not present

## 2024-07-15 DIAGNOSIS — C44519 Basal cell carcinoma of skin of other part of trunk: Secondary | ICD-10-CM | POA: Diagnosis not present

## 2024-07-17 ENCOUNTER — Encounter: Payer: Self-pay | Admitting: Sports Medicine

## 2024-07-21 DIAGNOSIS — C44519 Basal cell carcinoma of skin of other part of trunk: Secondary | ICD-10-CM | POA: Diagnosis not present

## 2024-07-21 DIAGNOSIS — S2242XD Multiple fractures of ribs, left side, subsequent encounter for fracture with routine healing: Secondary | ICD-10-CM | POA: Diagnosis not present

## 2024-07-21 DIAGNOSIS — F419 Anxiety disorder, unspecified: Secondary | ICD-10-CM | POA: Diagnosis not present

## 2024-07-21 DIAGNOSIS — Z9181 History of falling: Secondary | ICD-10-CM | POA: Diagnosis not present

## 2024-07-21 DIAGNOSIS — M545 Low back pain, unspecified: Secondary | ICD-10-CM | POA: Diagnosis not present

## 2024-07-21 DIAGNOSIS — I10 Essential (primary) hypertension: Secondary | ICD-10-CM | POA: Diagnosis not present

## 2024-07-21 DIAGNOSIS — M170 Bilateral primary osteoarthritis of knee: Secondary | ICD-10-CM | POA: Diagnosis not present

## 2024-07-21 DIAGNOSIS — G8929 Other chronic pain: Secondary | ICD-10-CM | POA: Diagnosis not present

## 2024-07-22 ENCOUNTER — Ambulatory Visit: Admitting: Sports Medicine

## 2024-07-22 ENCOUNTER — Encounter: Payer: Self-pay | Admitting: Sports Medicine

## 2024-07-22 VITALS — BP 126/92 | HR 80 | Temp 98.1°F | Resp 13 | Ht 61.0 in | Wt 175.4 lb

## 2024-07-22 DIAGNOSIS — R413 Other amnesia: Secondary | ICD-10-CM | POA: Diagnosis not present

## 2024-07-22 DIAGNOSIS — R350 Frequency of micturition: Secondary | ICD-10-CM | POA: Diagnosis not present

## 2024-07-22 DIAGNOSIS — M545 Low back pain, unspecified: Secondary | ICD-10-CM

## 2024-07-22 DIAGNOSIS — E538 Deficiency of other specified B group vitamins: Secondary | ICD-10-CM

## 2024-07-22 DIAGNOSIS — R300 Dysuria: Secondary | ICD-10-CM

## 2024-07-22 DIAGNOSIS — F411 Generalized anxiety disorder: Secondary | ICD-10-CM | POA: Diagnosis not present

## 2024-07-22 DIAGNOSIS — R0982 Postnasal drip: Secondary | ICD-10-CM

## 2024-07-22 DIAGNOSIS — G8929 Other chronic pain: Secondary | ICD-10-CM

## 2024-07-22 DIAGNOSIS — R5383 Other fatigue: Secondary | ICD-10-CM | POA: Diagnosis not present

## 2024-07-22 DIAGNOSIS — R7989 Other specified abnormal findings of blood chemistry: Secondary | ICD-10-CM | POA: Diagnosis not present

## 2024-07-22 LAB — POCT URINALYSIS DIPSTICK
Bilirubin, UA: NEGATIVE
Blood, UA: NEGATIVE
Glucose, UA: NEGATIVE
Ketones, UA: NEGATIVE
Nitrite, UA: NEGATIVE
Protein, UA: NEGATIVE
Spec Grav, UA: <=1.005 — AB (ref 1.010–1.025)
Urobilinogen, UA: 0.2 U/dL
pH, UA: 7.5 (ref 5.0–8.0)

## 2024-07-22 LAB — COMPLETE METABOLIC PANEL WITHOUT GFR
AG Ratio: 1.6 (calc) (ref 1.0–2.5)
ALT: 14 U/L (ref 6–29)
AST: 18 U/L (ref 10–35)
Albumin: 4.3 g/dL (ref 3.6–5.1)
Alkaline phosphatase (APISO): 50 U/L (ref 37–153)
BUN: 19 mg/dL (ref 7–25)
CO2: 25 mmol/L (ref 20–32)
Calcium: 9.6 mg/dL (ref 8.6–10.4)
Chloride: 100 mmol/L (ref 98–110)
Creat: 0.77 mg/dL (ref 0.60–0.95)
Globulin: 2.7 g/dL (ref 1.9–3.7)
Glucose, Bld: 89 mg/dL (ref 65–99)
Potassium: 4.4 mmol/L (ref 3.5–5.3)
Sodium: 138 mmol/L (ref 135–146)
Total Bilirubin: 0.3 mg/dL (ref 0.2–1.2)
Total Protein: 7 g/dL (ref 6.1–8.1)

## 2024-07-22 LAB — CBC WITH DIFFERENTIAL/PLATELET
Absolute Lymphocytes: 1799 {cells}/uL (ref 850–3900)
Absolute Monocytes: 560 {cells}/uL (ref 200–950)
Basophils Absolute: 49 {cells}/uL (ref 0–200)
Basophils Relative: 0.7 %
Eosinophils Absolute: 63 {cells}/uL (ref 15–500)
Eosinophils Relative: 0.9 %
HCT: 40.7 % (ref 35.0–45.0)
Hemoglobin: 13.3 g/dL (ref 11.7–15.5)
MCH: 30.9 pg (ref 27.0–33.0)
MCHC: 32.7 g/dL (ref 32.0–36.0)
MCV: 94.4 fL (ref 80.0–100.0)
MPV: 12.2 fL (ref 7.5–12.5)
Monocytes Relative: 8 %
Neutro Abs: 4529 {cells}/uL (ref 1500–7800)
Neutrophils Relative %: 64.7 %
Platelets: 234 Thousand/uL (ref 140–400)
RBC: 4.31 Million/uL (ref 3.80–5.10)
RDW: 12.8 % (ref 11.0–15.0)
Total Lymphocyte: 25.7 %
WBC: 7 Thousand/uL (ref 3.8–10.8)

## 2024-07-22 LAB — VITAMIN D 25 HYDROXY (VIT D DEFICIENCY, FRACTURES): Vit D, 25-Hydroxy: 72 ng/mL (ref 30–100)

## 2024-07-22 LAB — TSH: TSH: 1.16 m[IU]/L (ref 0.40–4.50)

## 2024-07-22 LAB — VITAMIN B12: Vitamin B-12: 557 pg/mL (ref 200–1100)

## 2024-07-22 MED ORDER — NITROFURANTOIN MONOHYD MACRO 100 MG PO CAPS: 100.0000 mg | ORAL_CAPSULE | Freq: Two times a day (BID) | ORAL | 0 refills | Status: AC

## 2024-07-22 MED ORDER — LORATADINE 10 MG PO TABS: 10.0000 mg | ORAL_TABLET | Freq: Every day | ORAL | 11 refills | Status: AC

## 2024-07-22 NOTE — Progress Notes (Unsigned)
 Careteam: Patient Care Team: Sherlynn Madden, MD as PCP - General (Internal Medicine) Croitoru, Jerel, MD as PCP - Cardiology (Cardiology)  PLACE OF SERVICE:  [facility]  Advanced Directive information    Allergies  Allergen Reactions   Amoxicillin-Pot Clavulanate Itching and Rash   Shrimp Extract Nausea And Vomiting   Codeine Nausea And Vomiting and Nausea Only    No chief complaint on file.    Discussed the use of AI scribe software for clinical note transcription with the patient, who gave verbal consent to proceed.  History of Present Illness    Carolyn Yang is an 83 year old female who presents with worsening memory problems and urinary symptoms. She is accompanied by her care taker who has been staying with patient from 11 to 3 pm  since 1 month.   Over the past six months, she has experienced worsening memory issues, with significant deterioration in the last month. She struggles with finishing sentences, finding words, and remembering names of long-known acquaintances. Her daughter assists with medication management using a pill box. Anxiety and agitation arise when she attempts to remember tasks or appointments.  She has a history of falls, with the most recent resulting in rib fractures and exacerbation of knee pain. Her memory issues have worsened since this last fall. There is no family history of dementia, and she denies any previous strokes. A CT scan from December 2024 showed no evidence of stroke or mass.  She experiences urinary incontinence, characterized by a loss of bladder control and accidents without dysuria. She consumes a lot of fluids but does not experience increased frequency of urination. No fever, nausea, vomiting, or lower abdominal pain are present.  She reports chronic back pain, particularly when standing, with pain in her legs and back after prolonged standing. Arthritis affects her hands, impairing tasks requiring finger  dexterity.  Her appetite is fair, and she has regular bowel movements .No recent vomiting or significant changes in appetite.  Review of Systems:  Review of Systems  Constitutional:  Negative for chills and fever.  HENT:  Negative for congestion and sore throat.   Respiratory:  Negative for cough, sputum production and shortness of breath.   Cardiovascular:  Negative for chest pain, palpitations and leg swelling.  Gastrointestinal:  Negative for abdominal pain, heartburn and nausea.  Genitourinary:  Negative for dysuria, frequency and hematuria.       Urinary accidents  Musculoskeletal:  Negative for falls and myalgias.  Neurological:  Negative for dizziness.  Psychiatric/Behavioral:  Positive for memory loss. The patient is nervous/anxious.    Negative unless indicated in HPI.   Past Medical History:  Diagnosis Date   Hyperlipidemia    Hypertension    Migraine    Osteoarthritis of both knees    Past Surgical History:  Procedure Laterality Date   ABSCESS DRAINAGE  2018   Intracranial empyema/masticator space abscess   BIOPSY BREAST     CESAREAN SECTION     CHOLECYSTECTOMY     DG GALL BLADDER  2000   LEFT OOPHORECTOMY     pelvis and verterbrae  2022   VAGINAL HYSTERECTOMY  1979   Social History:   reports that she has never smoked. She has never used smokeless tobacco. She reports that she does not currently use alcohol . She reports that she does not currently use drugs.  Family History  Problem Relation Age of Onset   Heart failure Mother    Arthritis-Osteo Mother    Stroke Father  Heart attack Father    Stroke Brother     Medications: Patient's Medications  New Prescriptions   No medications on file  Previous Medications   ACETAMINOPHEN  (TYLENOL ) 500 MG TABLET    Take 2 tablets (1,000 mg total) by mouth every 8 (eight) hours as needed.   ALENDRONATE (FOSAMAX) 70 MG TABLET    Take 70 mg by mouth once a week.   BACLOFEN  5 MG TABS    Take 1 tablet (5 mg total)  by mouth at bedtime.   BUSPIRONE  (BUSPAR ) 5 MG TABLET    Take 1 tablet (5 mg total) by mouth 2 (two) times daily.   DICLOFENAC SODIUM (VOLTAREN) 1 % GEL    Apply 4 g topically 4 (four) times daily.   FLUTICASONE  (FLONASE ) 50 MCG/ACT NASAL SPRAY    Place 2 sprays into both nostrils daily.   GUAIFENESIN  (MUCINEX ) 600 MG 12 HR TABLET    Take 2 tablets (1,200 mg total) by mouth 2 (two) times daily.   MIRTAZAPINE  (REMERON ) 15 MG TABLET    TAKE 1 TABLET BY MOUTH EVERYDAY AT BEDTIME   MULTIPLE VITAMINS-MINERALS (CENTRUM SILVER 50+WOMEN) TABS    Take 1 tablet by mouth daily at 6 (six) AM.   PROPRANOLOL  (INDERAL ) 20 MG TABLET    Take 0.5 tablets (10 mg total) by mouth daily.   ROSUVASTATIN  (CRESTOR ) 5 MG TABLET    Take 1 tablet (5 mg total) by mouth at bedtime.   SERTRALINE  (ZOLOFT ) 100 MG TABLET    Take 1.5 tablets (150 mg total) by mouth every morning.  Modified Medications   No medications on file  Discontinued Medications   No medications on file    Physical Exam: There were no vitals filed for this visit. There is no height or weight on file to calculate BMI. BP Readings from Last 3 Encounters:  05/26/24 120/78  05/12/24 110/82  04/28/24 (!) 143/82   Wt Readings from Last 3 Encounters:  05/12/24 178 lb 3.2 oz (80.8 kg)  04/28/24 173 lb 12.8 oz (78.8 kg)  04/20/24 173 lb 6.4 oz (78.7 kg)    Physical Exam Constitutional:      Appearance: Normal appearance.  HENT:     Head: Normocephalic and atraumatic.  Cardiovascular:     Rate and Rhythm: Normal rate and regular rhythm.  Pulmonary:     Effort: Pulmonary effort is normal. No respiratory distress.     Breath sounds: Normal breath sounds. No wheezing.  Abdominal:     General: Bowel sounds are normal. There is no distension.     Tenderness: There is no abdominal tenderness. There is no guarding or rebound.     Comments:    Musculoskeletal:        General: No swelling or tenderness.  Neurological:     Mental Status: She is alert.  Mental status is at baseline.     Motor: No weakness.     Labs reviewed: Basic Metabolic Panel: No results for input(s): NA, K, CL, CO2, GLUCOSE, BUN, CREATININE, CALCIUM , MG, PHOS, TSH in the last 8760 hours. Liver Function Tests: No results for input(s): AST, ALT, ALKPHOS, BILITOT, PROT, ALBUMIN in the last 8760 hours. No results for input(s): LIPASE, AMYLASE in the last 8760 hours. No results for input(s): AMMONIA in the last 8760 hours. CBC: No results for input(s): WBC, NEUTROABS, HGB, HCT, MCV, PLT in the last 8760 hours. Lipid Panel: No results for input(s): CHOL, HDL, LDLCALC, TRIG, CHOLHDL, LDLDIRECT in the last 8760 hours.  TSH: No results for input(s): TSH in the last 8760 hours. A1C: No results found for: HGBA1C  Assessment and Plan Assessment & Plan  1. Dysuria (Primary)  2. Urinary frequency Pt c/o urinary hesitancy and accidents Denies fevers, lower abdominal pain, nausea, vomiting  Urine dipstick positive Will send the urine for culture  Will start macrobid  Instructed patient to increase oral hydration   - Culture, Urine - nitrofurantoin , macrocrystal-monohydrate, (MACROBID ) 100 MG capsule; Take 1 capsule (100 mg total) by mouth 2 (two) times daily for 5 days.  Dispense: 10 capsule; Refill: 0  3. Memory deficits Pt notes worsening memory since last year  Pt reports forgetfulness, word finding difficulty  Caregiver reports that pt gets anxious when she cannot remember things  Family helping with finances and medication management, meal preparation  Pt requiring help with IADLS CT 2024  No age advanced or lobar predominant parenchymal atrophy.   There is no acute intracranial hemorrhage.   No demarcated cortical infarct.   No extra-axial fluid collection.   No evidence of an intracranial mass.   No midline shift.   Vascular: No hyperdense vessel on pre-contrast  imaging. Atherosclerotic calcifications. Enhancement of the proximal large arterial vessels and dural venous sinuses.  Will do SLUMS  23/ Discussed with patient regarding memory medications,  Most of them have side effects including diarrhea, weight loss, fall risk  Pt does not want to start any medications at this time  Instructed patient to increase physical activity and cognitively engaging activities   4. GAD (generalized anxiety disorder) Pt reports increased anxiety mostly related to her memory decline She is currently on zoloft  150 mg and buspar  5 mg bid Will refer the patient to behavioral therapy  - Ambulatory referral to Psychology  5. Chronic bilateral low back pain without sciatica Pt c/o pain all over her body  Had a fall recently and sustained rib fracture Currently doing physical therapy  Instructed patient to take tylenol  prn for pain  Use lidocaine patch and heating pad   6. Other fatigue Will check labs - CBC with Differential/Platelets - Complete Metabolic Panel with eGFR - TSH  7. B12 deficiency  - Vitamin B12  8. Low vitamin D  level   - Vitamin D , 25-hydroxy  9. Post-nasal drip  Pt c/o post nasal drip  Will start claritin   Cont with flonase   - loratadine  (CLARITIN ) 10 MG tablet; Take 1 tablet (10 mg total) by mouth daily.  Dispense: 30 tablet; Refill: 11      50 min  Total time spent for obtaining history,  performing a medically appropriate examination and evaluation, reviewing the tests,ordering  tests,  documenting clinical information in the electronic or other health record, independently interpreting results ,care coordination (not separately reported)

## 2024-07-23 ENCOUNTER — Ambulatory Visit: Payer: Self-pay | Admitting: Sports Medicine

## 2024-07-23 ENCOUNTER — Ambulatory Visit: Admitting: Pulmonary Disease

## 2024-07-23 ENCOUNTER — Encounter: Payer: Self-pay | Admitting: Pulmonary Disease

## 2024-07-23 VITALS — BP 100/82 | HR 80 | Temp 98.0°F | Ht 61.0 in | Wt 172.0 lb

## 2024-07-23 DIAGNOSIS — R0689 Other abnormalities of breathing: Secondary | ICD-10-CM | POA: Diagnosis not present

## 2024-07-23 DIAGNOSIS — R0600 Dyspnea, unspecified: Secondary | ICD-10-CM | POA: Diagnosis not present

## 2024-07-23 LAB — PULMONARY FUNCTION TEST
DL/VA % pred: 94 %
DL/VA: 3.91 ml/min/mmHg/L
DLCO unc % pred: 77 %
DLCO unc: 12.91 ml/min/mmHg
FEF 25-75 Post: 1.26 L/s
FEF 25-75 Pre: 1.64 L/s
FEF2575-%Change-Post: -23 %
FEF2575-%Pred-Post: 128 %
FEF2575-%Pred-Pre: 167 %
FEV1-%Change-Post: -3 %
FEV1-%Pred-Post: 104 %
FEV1-%Pred-Pre: 108 %
FEV1-Post: 1.55 L
FEV1-Pre: 1.61 L
FEV1FVC-%Change-Post: 1 %
FEV1FVC-%Pred-Pre: 109 %
FEV6-%Change-Post: -5 %
FEV6-%Pred-Post: 100 %
FEV6-%Pred-Pre: 106 %
FEV6-Post: 1.9 L
FEV6-Pre: 2.01 L
FEV6FVC-%Change-Post: 0 %
FEV6FVC-%Pred-Post: 107 %
FEV6FVC-%Pred-Pre: 106 %
FVC-%Change-Post: -5 %
FVC-%Pred-Post: 93 %
FVC-%Pred-Pre: 99 %
FVC-Post: 1.9 L
FVC-Pre: 2.02 L
Post FEV1/FVC ratio: 81 %
Post FEV6/FVC ratio: 100 %
Pre FEV1/FVC ratio: 80 %
Pre FEV6/FVC Ratio: 100 %
RV % pred: 76 %
RV: 1.79 L
TLC % pred: 82 %
TLC: 3.8 L

## 2024-07-23 NOTE — Patient Instructions (Signed)
 Full pft performed today.

## 2024-07-23 NOTE — Patient Instructions (Signed)
 VISIT SUMMARY:  Today, we discussed your episodes of shortness of breath, recent falls, cognitive difficulties, nasal drainage, and urinary tract infection. We reviewed your symptoms and made a plan to address each issue.  YOUR PLAN:  -ANXIETY-ASSOCIATED EPISODIC DYSPNEA: Your shortness of breath seems to be related to anxiety, especially when you are nervous or preparing to go somewhere. Since your lung tests and chest X-ray are normal, we believe anxiety is the cause. We will refer you to a psychiatrist or psychologist to help manage your anxiety.  -CHRONIC CLEAR NASAL DRAINAGE: Your persistent clear nasal drainage is likely due to allergies. You are already using Flonase  twice daily, which is the right treatment. Please continue using Flonase  as directed.  INSTRUCTIONS:  We will refer you to a psychiatrist or psychologist for anxiety management. Please continue using Flonase  twice daily for your nasal drainage.

## 2024-07-23 NOTE — Progress Notes (Signed)
 Full pft performed today.

## 2024-07-23 NOTE — Progress Notes (Signed)
 Haani Bakula    [id number]    25-Dec-1940  Primary Care Physician:Veludandi, Jackalyn, MD  Referring Physician: Jameya Pontiff, MD [address] [place],  [place] [address]  Chief complaint: Follow-up for dyspnea  HPI: 83 y.o. who  has a past medical history of Hyperlipidemia, Hypertension, Migraine, and Osteoarthritis of both knees.  Discussed the use of AI scribe software for clinical note transcription with the patient, who gave verbal consent to proceed.  History of Present Illness Stpehanie Montroy is an 83 year old female with hypertension and hyperlipidemia who presents with shortness of breath. She is accompanied by her daughter, Jan.  She has been experiencing intermittent shortness of breath since August 2021, initially noticed after her father's death. The episodes are often related to anxiety. Despite treatment with nebulizers, inhalers, and a device to blow into, she continues to experience episodes of coughing with small amounts of yellow phlegm.  She has a history of congestion and a persistent cough, which led to a chest x-ray that was clear. Her voice has been weak at times, and she takes Zyrtec, which improved her symptoms, although her voice remains weak occasionally. No history of smoking, acid reflux, or other lung issues such as asthma or COPD. She experiences dryness in her throat and mouth, requiring frequent water intake.  Her past medical history includes hypertension, hyperlipidemia, and osteoarthritis. She has been very active until about two years ago when she began experiencing significant weakness. She has seen a cardiologist, and her heart ultrasound and carotid artery tests were normal, with only age-related changes noted. She has seasonal allergies and is currently taking Flonase  and Zyrtec.  She recently completed a course of Macrobid  for a urinary tract infection but denies long-term exposure. She denies any family history of lung disease,  although her father had arteriosclerosis and strokes. She has not been exposed to any environmental factors at home that could affect her lungs, such as mold or feather pillows.  Her osteoarthritis, particularly in her knees, has significantly impacted her mobility, making it difficult to stand or walk. 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  Interim history Dejanira Pamintuan is an 83 year old female who presents with episodes of shortness of breath and recent falls.  Dyspnea - Episodes of shortness of breath primarily occur when nervous or preparing to go somewhere - Previous lung function tests and chest x-rays were unremarkable - Children are concerned about these episodes  Falls and musculoskeletal injury - Multiple falls resulting in fractured and displaced ribs - One fall attributed to loss of balance in the dark  Cognitive impairment - Struggles with word-finding and sentence completion - Previously demonstrated good mental capacity, having taught and preached for many years  Rhinorrhea - Clear nasal drainage described as 'clear phlegm' - Uses Flonase  twice daily for symptom management - Initiated Flonase  use after experiencing falls  Urinary tract infection - Currently taking medication for a urinary tract infection - Had a previous UTI that resolved with treatment  Relevant pulmonary history Pets: No pets Occupation: History Runner, broadcasting/film/video in high school.  Now retired Exposures: No mold, hot tub, Jacuzzi.  No feather pillows or comforter Smoking history: Never smoker Travel history: No significant travel history Relevant family history: No family history of lung disease   Outpatient Encounter Medications as of 07/23/2024  Medication Sig   acetaminophen  (TYLENOL ) 500 MG tablet Take 2 tablets (  1,000 mg total) by mouth every 8 (eight) hours as needed.   alendronate (FOSAMAX) 70 MG tablet Take 70 mg by mouth once a week.    Baclofen  5 MG TABS Take 1 tablet (5 mg total) by mouth at bedtime.   busPIRone  (BUSPAR ) 5 MG tablet Take 1 tablet (5 mg total) by mouth 2 (two) times daily.   diclofenac Sodium (VOLTAREN) 1 % GEL Apply 4 g topically as needed.   fluticasone  (FLONASE ) 50 MCG/ACT nasal spray Place 2 sprays into both nostrils daily.   guaiFENesin  (MUCINEX ) 600 MG 12 hr tablet Take 2 tablets (1,200 mg total) by mouth 2 (two) times daily.   loratadine  (CLARITIN ) 10 MG tablet Take 1 tablet (10 mg total) by mouth daily.   mirtazapine  (REMERON ) 15 MG tablet TAKE 1 TABLET BY MOUTH EVERYDAY AT BEDTIME   Multiple Vitamins-Minerals (CENTRUM SILVER 50+WOMEN) TABS Take 1 tablet by mouth daily at 6 (six) AM.   nitrofurantoin , macrocrystal-monohydrate, (MACROBID ) 100 MG capsule Take 1 capsule (100 mg total) by mouth 2 (two) times daily for 5 days.   propranolol  (INDERAL ) 20 MG tablet Take 0.5 tablets (10 mg total) by mouth daily.   rosuvastatin  (CRESTOR ) 5 MG tablet Take 1 tablet (5 mg total) by mouth at bedtime.   sertraline  (ZOLOFT ) 100 MG tablet Take 1.5 tablets (150 mg total) by mouth every morning.   No facility-administered encounter medications on file as of 07/23/2024.     Physical Exam: Blood pressure (!) 143/82, pulse 81, height 5' 1 (1.549 m), weight 173 lb 12.8 oz (78.8 kg), SpO2 93%. Gen:      No acute distress HEENT:  EOMI, sclera anicteric Neck:     No masses; no thyromegaly Lungs:    Clear to auscultation bilaterally; normal respiratory effort CV:         Regular rate and rhythm; no murmurs Abd:      + bowel sounds; soft, non-tender; no palpable masses, no distension Ext:    No edema; adequate peripheral perfusion Skin:      Warm and dry; no rash Neuro: alert and oriented x 3 Psych: normal mood and affect  Data Reviewed: Imaging: Chest x-ray 07/29/2023-no active cardiopulmonary disease. Chest x-ray 04/28/2024-no active cardiopulmonary disease I have reviewed the images  personally  PFTs: 07/23/2024 FVC 1.90 [93%], FEV1 1.55 [104%], F/F81, TLC 3.80 [82%], DLCO 12.81 [77%] Normal test  Labs:  Assessment & Plan Anxiety-associated episodic dyspnea Episodes of dyspnea occur primarily when she is nervous or preparing to go somewhere. Normal lung function tests and chest X-ray indicate no pulmonary cause. Symptoms are likely anxiety-related, corroborated by her primary care physician. - Referral to psychology has already been made by primary care  Chronic clear nasal drainage Persistent clear nasal drainage, likely related to allergies. Currently using Flonase  twice daily, which is appropriate for managing symptoms. - Continue Flonase  twice daily for nasal drainage.   Recommendations: Management of anxiety Follow-up as needed  Breasia Karges MD [facility] 07/23/2024, 4:32 PM  CC: Wajiha Versteeg, MD

## 2024-07-25 LAB — URINE CULTURE
MICRO NUMBER:: 16767331
SPECIMEN QUALITY:: ADEQUATE

## 2024-07-27 DIAGNOSIS — F419 Anxiety disorder, unspecified: Secondary | ICD-10-CM | POA: Diagnosis not present

## 2024-07-27 DIAGNOSIS — M170 Bilateral primary osteoarthritis of knee: Secondary | ICD-10-CM | POA: Diagnosis not present

## 2024-07-27 DIAGNOSIS — S2242XD Multiple fractures of ribs, left side, subsequent encounter for fracture with routine healing: Secondary | ICD-10-CM | POA: Diagnosis not present

## 2024-07-27 DIAGNOSIS — I10 Essential (primary) hypertension: Secondary | ICD-10-CM | POA: Diagnosis not present

## 2024-07-27 DIAGNOSIS — C44519 Basal cell carcinoma of skin of other part of trunk: Secondary | ICD-10-CM | POA: Diagnosis not present

## 2024-07-27 DIAGNOSIS — Z9181 History of falling: Secondary | ICD-10-CM | POA: Diagnosis not present

## 2024-07-27 DIAGNOSIS — M545 Low back pain, unspecified: Secondary | ICD-10-CM | POA: Diagnosis not present

## 2024-07-27 DIAGNOSIS — G8929 Other chronic pain: Secondary | ICD-10-CM | POA: Diagnosis not present

## 2024-07-28 DIAGNOSIS — D3132 Benign neoplasm of left choroid: Secondary | ICD-10-CM | POA: Diagnosis not present

## 2024-08-12 DIAGNOSIS — J40 Bronchitis, not specified as acute or chronic: Secondary | ICD-10-CM | POA: Diagnosis not present

## 2024-08-19 ENCOUNTER — Encounter: Payer: Self-pay | Admitting: Sports Medicine

## 2024-08-20 ENCOUNTER — Ambulatory Visit: Admitting: Adult Health

## 2024-08-20 ENCOUNTER — Encounter: Payer: Self-pay | Admitting: Adult Health

## 2024-08-20 VITALS — BP 136/80 | HR 81 | Ht 61.0 in | Wt 173.4 lb

## 2024-08-20 DIAGNOSIS — R350 Frequency of micturition: Secondary | ICD-10-CM

## 2024-08-20 DIAGNOSIS — N390 Urinary tract infection, site not specified: Secondary | ICD-10-CM | POA: Diagnosis not present

## 2024-08-20 LAB — POCT URINALYSIS DIPSTICK
Bilirubin, UA: NEGATIVE
Glucose, UA: NEGATIVE
Ketones, UA: NEGATIVE
Nitrite, UA: POSITIVE
Protein, UA: POSITIVE — AB
Spec Grav, UA: 1.025 (ref 1.010–1.025)
Urobilinogen, UA: 0.2 U/dL
pH, UA: 6 (ref 5.0–8.0)

## 2024-08-20 MED ORDER — SULFAMETHOXAZOLE-TRIMETHOPRIM 800-160 MG PO TABS: 1.0000 | ORAL_TABLET | Freq: Two times a day (BID) | ORAL | 0 refills | Status: AC

## 2024-08-20 NOTE — Progress Notes (Signed)
 [facility] clinic  Provider:  Jereld Serum DNP  Code Status:   Full Code  Goals of Care:     05/26/2024    2:07 PM  Advanced Directives  Does Patient Have a Medical Advance Directive? Yes  Type of Estate agent of [place];Out of facility DNR (pink MOST or yellow form);Living will  Does patient want to make changes to medical advance directive? No - Patient declined  Copy of Healthcare Power of Attorney in Chart? No - copy requested     Chief Complaint  Patient presents with   Urinary Tract Infection    Knee pain as well. Continued sinus issues.    Discussed the use of AI scribe software for clinical note transcription with the patient, who gave verbal consent to proceed.  HPI: Patient is a 83 y.o. female seen today for an acute visit for possible UTI. She was accompanied by her caregiver.  She has been experiencing urinary symptoms for the past two to three weeks, including urinary incontinence and an inability to hold her urine until reaching the bathroom. Initially, her urine was clear and she did not experience any burning sensation. However, she noted a change yesterday when her urine became darker and she began experiencing pain and a burning sensation during urination. She provided a urine sample for testing.  She has a history of urinary tract infections, with the last urine culture performed on July 30. No abdominal pain, chills, or fever. She experienced constipation a couple of times last week.  She has a history of falls, having fallen five times, with one incident resulting in fractured and displaced ribs.   She has a history of depression, for which she takes Zoloft  150 mg daily and Remeron  15 mg at bedtime. She sleeps well and does not experience withdrawal symptoms.  She has hypertension and has been taking propranolol  10 mg daily  for several years.    Past Medical History:  Diagnosis Date   Hyperlipidemia    Hypertension     Migraine    Osteoarthritis of both knees     Past Surgical History:  Procedure Laterality Date   ABSCESS DRAINAGE  2018   Intracranial empyema/masticator space abscess   BIOPSY BREAST     CESAREAN SECTION     CHOLECYSTECTOMY     DG GALL BLADDER  2000   LEFT OOPHORECTOMY     pelvis and verterbrae  2022   VAGINAL HYSTERECTOMY  1979    Allergies  Allergen Reactions   Amoxicillin-Pot Clavulanate Itching and Rash   Shrimp Extract Nausea And Vomiting   Codeine Nausea And Vomiting and Nausea Only    Outpatient Encounter Medications as of 08/20/2024  Medication Sig   acetaminophen  (TYLENOL ) 500 MG tablet Take 2 tablets (1,000 mg total) by mouth every 8 (eight) hours as needed.   alendronate (FOSAMAX) 70 MG tablet Take 70 mg by mouth once a week.   Baclofen  5 MG TABS Take 1 tablet (5 mg total) by mouth at bedtime.   busPIRone  (BUSPAR ) 5 MG tablet Take 1 tablet (5 mg total) by mouth 2 (two) times daily.   diclofenac Sodium (VOLTAREN) 1 % GEL Apply 4 g topically as needed.   fluticasone  (FLONASE ) 50 MCG/ACT nasal spray Place 2 sprays into both nostrils daily.   guaiFENesin  (MUCINEX ) 600 MG 12 hr tablet Take 2 tablets (1,200 mg total) by mouth 2 (two) times daily.   loratadine  (CLARITIN ) 10 MG tablet Take 1 tablet (10 mg total) by  mouth daily.   mirtazapine  (REMERON ) 15 MG tablet TAKE 1 TABLET BY MOUTH EVERYDAY AT BEDTIME   Multiple Vitamins-Minerals (CENTRUM SILVER 50+WOMEN) TABS Take 1 tablet by mouth daily at 6 (six) AM.   propranolol  (INDERAL ) 20 MG tablet Take 0.5 tablets (10 mg total) by mouth daily.   rosuvastatin  (CRESTOR ) 5 MG tablet Take 1 tablet (5 mg total) by mouth at bedtime.   sertraline  (ZOLOFT ) 100 MG tablet Take 1.5 tablets (150 mg total) by mouth every morning.   sulfamethoxazole -trimethoprim  (BACTRIM  DS) 800-160 MG tablet Take 1 tablet by mouth 2 (two) times daily for 5 days.   No facility-administered encounter medications on file as of 08/20/2024.    Review of  Systems:  Review of Systems  Constitutional:  Negative for appetite change, chills, fatigue and fever.  HENT:  Negative for congestion, hearing loss, rhinorrhea and sore throat.   Eyes: Negative.   Respiratory:  Negative for cough, shortness of breath and wheezing.   Cardiovascular:  Negative for chest pain, palpitations and leg swelling.  Gastrointestinal:  Negative for abdominal pain, constipation, diarrhea, nausea and vomiting.  Genitourinary:  Positive for dysuria, frequency and urgency.  Musculoskeletal:  Negative for arthralgias, back pain and myalgias.  Skin:  Negative for color change, rash and wound.  Neurological:  Negative for dizziness, weakness and headaches.  Psychiatric/Behavioral:  Negative for behavioral problems. The patient is not nervous/anxious.     Health Maintenance  Topic Date Due   COVID-19 Vaccine (8 - Pfizer risk 2024-25 season) 02/25/2024   Medicare Annual Wellness (AWV)  03/03/2024   INFLUENZA VACCINE  07/24/2024   DTaP/Tdap/Td (2 - Td or Tdap) 05/26/2025 (Originally 10/01/2023)   Pneumococcal Vaccine: 50+ Years  Completed   DEXA SCAN  Completed   Zoster Vaccines- Shingrix  Completed   HPV VACCINES  Aged Out   Meningococcal B Vaccine  Aged Out   Hepatitis B Vaccines 19-59 Average Risk  Discontinued    Physical Exam: Vitals:   08/20/24 1147  BP: 136/80  Pulse: 81  SpO2: 96%  Weight: 173 lb 6.4 oz (78.7 kg)  Height: 5' 1 (1.549 m)   Body mass index is 32.76 kg/m. Physical Exam Constitutional:      Appearance: She is obese.  HENT:     Head: Normocephalic and atraumatic.     Nose: Nose normal.     Mouth/Throat:     Mouth: Mucous membranes are moist.  Eyes:     Conjunctiva/sclera: Conjunctivae normal.  Cardiovascular:     Rate and Rhythm: Normal rate and regular rhythm.  Pulmonary:     Effort: Pulmonary effort is normal.     Breath sounds: Normal breath sounds.  Abdominal:     General: Bowel sounds are normal.     Palpations: Abdomen is  soft.  Musculoskeletal:        General: Normal range of motion.     Cervical back: Normal range of motion.  Skin:    General: Skin is warm and dry.  Neurological:     Mental Status: She is alert.  Psychiatric:        Mood and Affect: Mood normal.        Behavior: Behavior normal.        Thought Content: Thought content normal.        Judgment: Judgment normal.     Labs reviewed: Basic Metabolic Panel: Recent Labs    07/22/24 1603  NA 138  K 4.4  CL 100  CO2 25  GLUCOSE 89  BUN 19  CREATININE 0.77  CALCIUM  9.6  TSH 1.16   Liver Function Tests: Recent Labs    07/22/24 1603  AST 18  ALT 14  BILITOT 0.3  PROT 7.0   No results for input(s): LIPASE, AMYLASE in the last 8760 hours. No results for input(s): AMMONIA in the last 8760 hours. CBC: Recent Labs    07/22/24 1603  WBC 7.0  NEUTROABS 4,529  HGB 13.3  HCT 40.7  MCV 94.4  PLT 234   Lipid Panel: No results for input(s): CHOL, HDL, LDLCALC, TRIG, CHOLHDL, LDLDIRECT in the last 8760 hours. No results found for: HGBA1C  Procedures since last visit: No results found.  Assessment/Plan  1. Urinary tract infection without hematuria, site unspecified (Primary)  Urinary frequency -  Urinary tract infection with urinary incontinence and frequency UTI suspected due to incontinence, frequency, dysuria, and dark urine. Positive urine dipstick for nitrate, blood, and leukocytes.  - Prescribed Bactrim , sent prescription to [facility] on [address]. - Sent urine for culture to confirm UTI, adjust treatment if necessary. - Advised increased water intake. - Educated on wiping front to back to prevent UTIs. - Encouraged regular bowel movements, increased vegetables and fiber intake.  - POC Urinalysis Dipstick - Urine Culture - sulfamethoxazole -trimethoprim  (BACTRIM  DS) 800-160 MG tablet; Take 1 tablet by mouth 2 (two) times daily for 5 days.  Dispense: 10 tablet; Refill: 0    Labs/tests ordered:    POC urine dipstick, urine dipstick   Return if symptoms worsen or fail to improve.  Carolyn Bassette Medina-Vargas, NP

## 2024-08-22 ENCOUNTER — Ambulatory Visit: Payer: Self-pay | Admitting: Adult Health

## 2024-08-22 LAB — URINE CULTURE
MICRO NUMBER:: 16898267
SPECIMEN QUALITY:: ADEQUATE

## 2024-08-22 NOTE — Progress Notes (Signed)
-   Urine culture bacteria isolated was sensitive to Bactrim  which was prescribed during clinic visit, continue taking Bactrim  DS as prescribed.

## 2024-08-31 ENCOUNTER — Other Ambulatory Visit: Payer: Self-pay | Admitting: Sports Medicine

## 2024-08-31 DIAGNOSIS — R0982 Postnasal drip: Secondary | ICD-10-CM

## 2024-09-03 ENCOUNTER — Encounter: Payer: Self-pay | Admitting: Sports Medicine

## 2024-09-04 MED ORDER — ALENDRONATE SODIUM 70 MG PO TABS: 70.0000 mg | ORAL_TABLET | ORAL | 1 refills | Status: AC

## 2024-09-17 ENCOUNTER — Encounter: Payer: Self-pay | Admitting: Sports Medicine

## 2024-09-17 MED ORDER — NIRMATRELVIR/RITONAVIR (PAXLOVID)TABLET: 3.0000 | ORAL_TABLET | Freq: Two times a day (BID) | ORAL | 0 refills | Status: AC

## 2024-09-17 NOTE — Telephone Encounter (Signed)
 Carolyn Yang (daughter) called stating that her mother does not know how to use MyChart for virtual visit and is not able to come to office. She is wondering if it is possible to skip the visit.   Callback: [phone]

## 2024-09-19 ENCOUNTER — Other Ambulatory Visit: Payer: Self-pay | Admitting: Sports Medicine

## 2024-10-01 DIAGNOSIS — H25043 Posterior subcapsular polar age-related cataract, bilateral: Secondary | ICD-10-CM | POA: Diagnosis not present

## 2024-10-01 DIAGNOSIS — H18413 Arcus senilis, bilateral: Secondary | ICD-10-CM | POA: Diagnosis not present

## 2024-10-01 DIAGNOSIS — H25013 Cortical age-related cataract, bilateral: Secondary | ICD-10-CM | POA: Diagnosis not present

## 2024-10-01 DIAGNOSIS — H2511 Age-related nuclear cataract, right eye: Secondary | ICD-10-CM | POA: Diagnosis not present

## 2024-10-01 DIAGNOSIS — H2513 Age-related nuclear cataract, bilateral: Secondary | ICD-10-CM | POA: Diagnosis not present

## 2024-10-22 DIAGNOSIS — E669 Obesity, unspecified: Secondary | ICD-10-CM | POA: Diagnosis not present

## 2024-10-22 DIAGNOSIS — N182 Chronic kidney disease, stage 2 (mild): Secondary | ICD-10-CM | POA: Diagnosis not present

## 2024-10-22 DIAGNOSIS — E785 Hyperlipidemia, unspecified: Secondary | ICD-10-CM | POA: Diagnosis not present

## 2024-10-22 DIAGNOSIS — F411 Generalized anxiety disorder: Secondary | ICD-10-CM | POA: Diagnosis not present

## 2024-10-22 DIAGNOSIS — Z79899 Other long term (current) drug therapy: Secondary | ICD-10-CM | POA: Diagnosis not present

## 2024-10-22 DIAGNOSIS — F329 Major depressive disorder, single episode, unspecified: Secondary | ICD-10-CM | POA: Diagnosis not present

## 2024-10-22 DIAGNOSIS — M1990 Unspecified osteoarthritis, unspecified site: Secondary | ICD-10-CM | POA: Diagnosis not present

## 2024-10-22 DIAGNOSIS — M810 Age-related osteoporosis without current pathological fracture: Secondary | ICD-10-CM | POA: Diagnosis not present

## 2024-10-22 DIAGNOSIS — R32 Unspecified urinary incontinence: Secondary | ICD-10-CM | POA: Diagnosis not present

## 2024-10-25 ENCOUNTER — Other Ambulatory Visit: Payer: Self-pay | Admitting: Sports Medicine

## 2024-10-26 NOTE — Telephone Encounter (Signed)
 Patient is seeing Carolyn Raisin NP for next visit. Routing medicaton for further review to be okay to approve.

## 2024-10-27 ENCOUNTER — Ambulatory Visit: Admitting: Sports Medicine

## 2024-11-04 ENCOUNTER — Ambulatory Visit (INDEPENDENT_AMBULATORY_CARE_PROVIDER_SITE_OTHER): Payer: Self-pay | Admitting: Nurse Practitioner

## 2024-11-04 ENCOUNTER — Encounter: Payer: Self-pay | Admitting: Nurse Practitioner

## 2024-11-04 VITALS — BP 122/84 | HR 72 | Temp 98.2°F | Ht 61.0 in | Wt 173.0 lb

## 2024-11-04 DIAGNOSIS — F411 Generalized anxiety disorder: Secondary | ICD-10-CM | POA: Diagnosis not present

## 2024-11-04 DIAGNOSIS — M810 Age-related osteoporosis without current pathological fracture: Secondary | ICD-10-CM | POA: Diagnosis not present

## 2024-11-04 DIAGNOSIS — M545 Low back pain, unspecified: Secondary | ICD-10-CM

## 2024-11-04 DIAGNOSIS — G8929 Other chronic pain: Secondary | ICD-10-CM | POA: Insufficient documentation

## 2024-11-04 DIAGNOSIS — M150 Primary generalized (osteo)arthritis: Secondary | ICD-10-CM | POA: Diagnosis not present

## 2024-11-04 DIAGNOSIS — R296 Repeated falls: Secondary | ICD-10-CM | POA: Diagnosis not present

## 2024-11-04 DIAGNOSIS — G43809 Other migraine, not intractable, without status migrainosus: Secondary | ICD-10-CM

## 2024-11-04 DIAGNOSIS — J309 Allergic rhinitis, unspecified: Secondary | ICD-10-CM

## 2024-11-04 DIAGNOSIS — R413 Other amnesia: Secondary | ICD-10-CM | POA: Insufficient documentation

## 2024-11-04 NOTE — Progress Notes (Signed)
 Careteam: Patient Care Team: Caro Harlene POUR, NP as PCP - General (Geriatric Medicine)  PLACE OF SERVICE:  [facility]  Advanced Directive information    Allergies  Allergen Reactions   Amoxicillin-Pot Clavulanate Itching and Rash   Shrimp Extract Nausea And Vomiting   Codeine Nausea And Vomiting and Nausea Only    Chief Complaint  Patient presents with   Medical Management of Chronic Issues    4 month routine visit.     HPI:  Discussed the use of AI scribe software for clinical note transcription with the patient, who gave verbal consent to proceed.  History of Present Illness Carolyn Yang is an 83 year old female who presents for a four-month follow-up after experiencing falls.  She is here with her caregiver who she feels like is part of her family.   She reports  has experienced multiple falls over the past year, with the most recent occurring about a month ago while getting dressed, resulting in bruising on her arm and increased pain in her knees and back. She managed to get up by herself using a dresser for support. Her knee pain has worsened since the falls, and she has previously received injections for this issue. She uses knee braces and has engaged in physical therapy to aid her mobility.  She has a history of shortness of breath, for which she has seen a pulmonologist and a cardiologist. Both specialists told her that her heart and lungs were normal, and she recalls being told that her symptoms were not due to a heart or lung problem but due to anxiety. She is currently taking Buspar  5 mg twice daily, Zoloft  150 mg in the morning, and Remeron  15 mg at bedtime for anxiety and mood stabilization. Her anxiety began after the falls started, and she experiences episodes of anxiety with any minor stressor. She reports memory issues, particularly when anxious or upset, affecting her ability to recall information.  She experiences persistent nasal drainage, likely related  to allergies, and uses Flonase  and Zyrtec for management. Despite these treatments, she continues to have nasal drainage and coughs up small amounts of yellow phlegm.  She is on Fosamax , although she is unsure when she started this medication and does not recall being told she has osteoporosis.    She also reports arthritis in her hands, which are 'drawing,' and takes Tylenol  500 mg three times a day for pain management.  She has a history of migraines and has been on propranolol  for about twenty years, initially prescribed by a cardiologist. She is unsure of the dose she is taking. Reports her son fills her medication box.    Review of Systems:  Review of Systems  Constitutional:  Negative for chills, fever and weight loss.  HENT:  Negative for tinnitus.   Respiratory:  Positive for shortness of breath. Negative for cough and sputum production.   Cardiovascular:  Negative for chest pain, palpitations and leg swelling.  Gastrointestinal:  Negative for abdominal pain, constipation, diarrhea and heartburn.  Genitourinary:  Negative for dysuria, frequency and urgency.  Musculoskeletal:  Negative for back pain, joint pain and myalgias.  Skin: Negative.   Neurological:  Positive for weakness. Negative for dizziness and headaches.  Psychiatric/Behavioral:  Positive for memory loss. Negative for depression. The patient is nervous/anxious. The patient does not have insomnia.     Past Medical History:  Diagnosis Date   Hyperlipidemia    Hypertension    Migraine    Osteoarthritis of both knees  Past Surgical History:  Procedure Laterality Date   ABSCESS DRAINAGE  2018   Intracranial empyema/masticator space abscess   BIOPSY BREAST     CESAREAN SECTION     CHOLECYSTECTOMY     DG GALL BLADDER  2000   LEFT OOPHORECTOMY     pelvis and verterbrae  2022   VAGINAL HYSTERECTOMY  1979   Social History:   reports that she has never smoked. She has never used smokeless tobacco. She reports  that she does not currently use alcohol . She reports that she does not currently use drugs.  Family History  Problem Relation Age of Onset   Heart failure Mother    Arthritis-Osteo Mother    Stroke Father    Heart attack Father    Stroke Brother     Medications: Patient's Medications  New Prescriptions   No medications on file  Previous Medications   ACETAMINOPHEN  EXTRA STRENGTH 500 MG TABS    TAKE 2 TABLETS BY MOUTH EVERY 8 HOURS AS NEEDED   ALENDRONATE  (FOSAMAX ) 70 MG TABLET    Take 1 tablet (70 mg total) by mouth once a week.   BUSPIRONE  (BUSPAR ) 5 MG TABLET    TAKE 1 TABLET BY MOUTH TWICE A DAY   DICLOFENAC SODIUM (VOLTAREN) 1 % GEL    Apply 4 g topically as needed.   FLUTICASONE  (FLONASE ) 50 MCG/ACT NASAL SPRAY    SPRAY 2 SPRAYS INTO EACH NOSTRIL EVERY DAY   LORATADINE  (CLARITIN ) 10 MG TABLET    Take 1 tablet (10 mg total) by mouth daily.   MIRTAZAPINE  (REMERON ) 15 MG TABLET    TAKE 1 TABLET BY MOUTH EVERYDAY AT BEDTIME   MULTIPLE VITAMINS-MINERALS (CENTRUM SILVER 50+WOMEN) TABS    Take 1 tablet by mouth daily at 6 (six) AM.   PROPRANOLOL  (INDERAL ) 20 MG TABLET    Take 0.5 tablets (10 mg total) by mouth daily.   ROSUVASTATIN  (CRESTOR ) 5 MG TABLET    TAKE 1 TABLET BY MOUTH EVERYDAY AT BEDTIME   SERTRALINE  (ZOLOFT ) 100 MG TABLET    Take 1.5 tablets (150 mg total) by mouth every morning.  Modified Medications   No medications on file  Discontinued Medications   BACLOFEN  5 MG TABS    Take 1 tablet (5 mg total) by mouth at bedtime.   GUAIFENESIN  (MUCINEX ) 600 MG 12 HR TABLET    Take 2 tablets (1,200 mg total) by mouth 2 (two) times daily.    Physical Exam:  Vitals:   11/04/24 1146  BP: 122/84  Pulse: 72  Temp: 98.2 F (36.8 C)  SpO2: 96%  Weight: 173 lb (78.5 kg)  Height: 5' 1 (1.549 m)   Body mass index is 32.69 kg/m. Wt Readings from Last 3 Encounters:  11/04/24 173 lb (78.5 kg)  08/20/24 173 lb 6.4 oz (78.7 kg)  07/23/24 172 lb (78 kg)    Physical  Exam Constitutional:      General: She is not in acute distress.    Appearance: She is well-developed. She is not diaphoretic.  HENT:     Head: Normocephalic and atraumatic.     Mouth/Throat:     Pharynx: No oropharyngeal exudate.  Eyes:     Conjunctiva/sclera: Conjunctivae normal.     Pupils: Pupils are equal, round, and reactive to light.  Cardiovascular:     Rate and Rhythm: Normal rate and regular rhythm.     Heart sounds: Normal heart sounds.  Pulmonary:     Effort: Pulmonary effort is normal.  Tachypnea present.     Breath sounds: Normal breath sounds.  Abdominal:     General: Bowel sounds are normal.     Palpations: Abdomen is soft.  Musculoskeletal:     Cervical back: Normal range of motion and neck supple.     Right lower leg: No edema.     Left lower leg: No edema.  Skin:    General: Skin is warm and dry.  Neurological:     Mental Status: She is alert and oriented to person, place, and time.     Motor: Weakness present.     Gait: Gait abnormal.  Psychiatric:        Mood and Affect: Mood normal.     Labs reviewed: Basic Metabolic Panel: Recent Labs    07/22/24 1603  NA 138  K 4.4  CL 100  CO2 25  GLUCOSE 89  BUN 19  CREATININE 0.77  CALCIUM  9.6  TSH 1.16   Liver Function Tests: Recent Labs    07/22/24 1603  AST 18  ALT 14  BILITOT 0.3  PROT 7.0   No results for input(s): LIPASE, AMYLASE in the last 8760 hours. No results for input(s): AMMONIA in the last 8760 hours. CBC: Recent Labs    07/22/24 1603  WBC 7.0  NEUTROABS 4,529  HGB 13.3  HCT 40.7  MCV 94.4  PLT 234   Lipid Panel: No results for input(s): CHOL, HDL, LDLCALC, TRIG, CHOLHDL, LDLDIRECT in the last 8760 hours. TSH: Recent Labs    07/22/24 1603  TSH 1.16   A1C: No results found for: HGBA1C   Assessment/Plan  Assessment & Plan   Generalized anxiety disorder Anxiety persists despite current medication Reports falls and mobility issues  exacerbate anxiety.  She has been on remeron  but does not seem like it has benefited her with being on zoloft  and buspar  . - Discontinued Remeron  by tapering to half a tablet daily for one week, then stop. - Continue Buspar  as prescribed. - Monitor anxiety symptoms after discontinuation of Remeron  and will consider adjusting Buspar  if needed.  Osteoarthritis with chronic pain in knees, hands, shoulders, and low back Chronic pain likely due to osteoarthritis, exacerbated by falls. Current management includes Tylenol  and physical therapy. - Continue Tylenol  500 mg 2 tablets three times daily for pain management. - Encouraged use of Voltaren gel on knees. - Advised continuation of physical therapy exercises.  Osteoporosis Currently on Fosamax . Falls increase fracture risk. -Do not see previous records. Will request previous medical records to see when she started fosmax -Recommended to take calcium  600 mg twice daily with Vitamin D  2000 units daily and weight bearing activity 30 mins/5 days a week - Continue Fosamax  as prescribed.  Recurrent falls No recent falls. Reports a history of a fall that caused significant bruising and discomfort but denies any pain related to this at time of visit.  -continue with fall precautions and support from family and caregivers.   Allergic rhinitis with persistent nasal drainage Persistent nasal drainage likely due to allergies. Current management includes Flonase  and claritin . - Continue current regimen - Encouraged adequate hydration to help with mucus clearance.  Memory loss Reports memory loss, particularly when upset or anxious. Has support from family and caregiver.   Chronic low back pain Stable, managed with tylenol  Has completed PT   Hx of migraines She has been on propranolol  for years due to hx of migraine Previous PCP has reduced to half tablet daily but unsure if she is taking  this dose Caregiver will check with son.    Return in  about 6 weeks (around 12/16/2024) for follow up .  Rameen Quinney K. Caro BODILY [facility] [phone]

## 2024-11-04 NOTE — Patient Instructions (Addendum)
 Decrease remeron  to half tablet for 1 week then stop-- since this has not seemed to help or change mood  To see what you are taking on propranolol - you should be taking HALF tablet to =10 mg daily

## 2024-12-14 ENCOUNTER — Ambulatory Visit: Admitting: Nurse Practitioner

## 2024-12-18 ENCOUNTER — Other Ambulatory Visit: Payer: Self-pay | Admitting: Sports Medicine

## 2024-12-18 NOTE — Telephone Encounter (Signed)
Requested medication is not on active medication list  

## 2024-12-25 ENCOUNTER — Ambulatory Visit: Admitting: Nurse Practitioner

## 2025-01-29 ENCOUNTER — Encounter: Payer: Self-pay | Admitting: Nurse Practitioner

## 2025-01-29 ENCOUNTER — Ambulatory Visit: Admitting: Nurse Practitioner

## 2025-01-29 VITALS — BP 128/80 | HR 84 | Temp 97.8°F | Ht 61.0 in | Wt 168.4 lb

## 2025-01-29 DIAGNOSIS — H6121 Impacted cerumen, right ear: Secondary | ICD-10-CM

## 2025-01-29 DIAGNOSIS — R490 Dysphonia: Secondary | ICD-10-CM

## 2025-01-29 DIAGNOSIS — R413 Other amnesia: Secondary | ICD-10-CM

## 2025-01-29 DIAGNOSIS — F411 Generalized anxiety disorder: Secondary | ICD-10-CM

## 2025-01-29 DIAGNOSIS — E785 Hyperlipidemia, unspecified: Secondary | ICD-10-CM

## 2025-01-29 LAB — CBC WITH DIFFERENTIAL/PLATELET
Absolute Lymphocytes: 1847 {cells}/uL (ref 850–3900)
Absolute Monocytes: 631 {cells}/uL (ref 200–950)
Basophils Absolute: 38 {cells}/uL (ref 0–200)
Basophils Relative: 0.5 %
Eosinophils Absolute: 61 {cells}/uL (ref 15–500)
Eosinophils Relative: 0.8 %
HCT: 42.7 % (ref 35.9–46.0)
Hemoglobin: 14.3 g/dL (ref 11.7–15.5)
MCH: 31.2 pg (ref 27.0–33.0)
MCHC: 33.5 g/dL (ref 31.6–35.4)
MCV: 93 fL (ref 81.4–101.7)
MPV: 12.6 fL — ABNORMAL HIGH (ref 7.5–12.5)
Monocytes Relative: 8.3 %
Neutro Abs: 5024 {cells}/uL (ref 1500–7800)
Neutrophils Relative %: 66.1 %
Platelets: 229 10*3/uL (ref 140–400)
RBC: 4.59 Million/uL (ref 3.80–5.10)
RDW: 12.5 % (ref 11.0–15.0)
Total Lymphocyte: 24.3 %
WBC: 7.6 10*3/uL (ref 3.8–10.8)

## 2025-01-29 MED ORDER — BUSPIRONE HCL 10 MG PO TABS: 10.0000 mg | ORAL_TABLET | Freq: Two times a day (BID) | ORAL | 1 refills | Status: AC

## 2025-01-29 NOTE — Progress Notes (Unsigned)
 Ceruminosis is noted.  Wax is removed by warm water and peroxide. Successfully removed large amount of wax from right ear. Patient tolerated well.

## 2025-01-29 NOTE — Patient Instructions (Addendum)
 Increase to buspar  10 mg by mouth twice daily   You will be contacted in regards to your ENT referral And your CT head,  If you do not hear anything contact the office

## 2025-01-29 NOTE — Progress Notes (Unsigned)
 "   Careteam: Patient Care Team: Caro Harlene POUR, NP as PCP - General (Geriatric Medicine)  PLACE OF SERVICE:  [facility]  Advanced Directive information    Allergies[1]  Chief Complaint  Patient presents with   Medication management of chronic issues    6 week follow up    HPI:  Discussed the use of AI scribe software for clinical note transcription with the patient, who gave verbal consent to proceed.  History of Present Illness Carolyn Yang is an 84 year old female who presents with concerns about swallowing pain, voice changes, memory loss, and anxiety.  She experiences pain when swallowing and has a low, raspy voice. Her voice changes when her hearing aids are not in use. She has been using Claritin  and Flonase  for a suspected postnasal drip, which initially improved her symptoms, but they have since returned. The pain when swallowing is a new symptom. No indigestion or acid reflux is reported.  She describes significant memory loss, stating she forgets things and can't remember what she's about to do in the house. This memory decline has been gradual and is noted to be significant. A CT scan was performed in July 2024 due to memory loss, and she feels her memory has worsened since then.  She experiences anxiety, which she feels is exacerbated by her memory issues. She has a history of anxiety and depression, particularly since the death of her husband. She is currently taking Zoloft  100 mg (1.5 tablets daily) and Buspar  5 mg twice a day for anxiety, but she feels the anxiety is not well-controlled.  She mentions having cataract surgery in January and notes some swelling and puffiness around her eyes, which she finds concerning. She also reports soreness in her ankles and mentions that she is supposed to wear support stockings but has not been doing so regularly.  In terms of her gastrointestinal symptoms, she denies indigestion, acid reflux, and diarrhea, and reports normal  bowel movements. She also mentions that her ears have been good until recently, and she has some wax buildup in one ear.  ***  Review of Systems:  Review of Systems  Constitutional:  Negative for chills, fever and weight loss.  HENT:  Negative for tinnitus.   Respiratory:  Negative for cough, sputum production and shortness of breath.   Cardiovascular:  Negative for chest pain, palpitations and leg swelling.  Gastrointestinal:  Negative for abdominal pain, constipation, diarrhea and heartburn.  Genitourinary:  Negative for dysuria, frequency and urgency.  Musculoskeletal:  Positive for joint pain and myalgias. Negative for back pain and falls.  Skin: Negative.   Neurological:  Negative for dizziness and headaches.  Psychiatric/Behavioral:  Positive for depression and memory loss. The patient is nervous/anxious. The patient does not have insomnia.   ***  Past Medical History:  Diagnosis Date   Hyperlipidemia    Hypertension    Migraine    Osteoarthritis of both knees    Past Surgical History:  Procedure Laterality Date   ABSCESS DRAINAGE  2018   Intracranial empyema/masticator space abscess   BIOPSY BREAST     CATARACT EXTRACTION Bilateral 11/23/2024   11/2024, 12/2024   CESAREAN SECTION     CHOLECYSTECTOMY     DG GALL BLADDER  2000   LEFT OOPHORECTOMY     pelvis and verterbrae  2022   VAGINAL HYSTERECTOMY  1979   Social History:   reports that she has never smoked. She has never used smokeless tobacco. She reports that she does not  currently use alcohol . She reports that she does not currently use drugs.  Family History  Problem Relation Age of Onset   Heart failure Mother    Arthritis-Osteo Mother    Stroke Father    Heart attack Father    Stroke Brother     Medications: Patient's Medications  New Prescriptions   No medications on file  Previous Medications   ACETAMINOPHEN  EXTRA STRENGTH 500 MG TABS    TAKE 2 TABLETS BY MOUTH EVERY 8 HOURS AS NEEDED    ALENDRONATE  (FOSAMAX ) 70 MG TABLET    Take 1 tablet (70 mg total) by mouth once a week.   BROMFENAC SODIUM 0.07 % SOLN    Apply 1 drop to eye 2 (two) times daily.   BUSPIRONE  (BUSPAR ) 5 MG TABLET    TAKE 1 TABLET BY MOUTH TWICE A DAY   DICLOFENAC SODIUM (VOLTAREN) 1 % GEL    Apply 4 g topically as needed.   DIFLUPREDNATE 0.05 % EMUL    Apply 1 drop to eye 4 (four) times daily.   FLUTICASONE  (FLONASE ) 50 MCG/ACT NASAL SPRAY    SPRAY 2 SPRAYS INTO EACH NOSTRIL EVERY DAY   LORATADINE  (CLARITIN ) 10 MG TABLET    Take 1 tablet (10 mg total) by mouth daily.   MULTIPLE VITAMINS-MINERALS (CENTRUM SILVER 50+WOMEN) TABS    Take 1 tablet by mouth daily at 6 (six) AM.   PROPRANOLOL  (INDERAL ) 20 MG TABLET    Take 0.5 tablets (10 mg total) by mouth daily.   ROSUVASTATIN  (CRESTOR ) 5 MG TABLET    TAKE 1 TABLET BY MOUTH EVERYDAY AT BEDTIME   SERTRALINE  (ZOLOFT ) 100 MG TABLET    Take 1.5 tablets (150 mg total) by mouth every morning.  Modified Medications   No medications on file  Discontinued Medications   No medications on file    Physical Exam:  Vitals:   01/29/25 1337  BP: 128/80  Pulse: 84  Temp: 97.8 F (36.6 C)  SpO2: 99%  Weight: 168 lb 6.4 oz (76.4 kg)  Height: 5' 1 (1.549 m)   Body mass index is 31.82 kg/m. Wt Readings from Last 3 Encounters:  01/29/25 168 lb 6.4 oz (76.4 kg)  11/04/24 173 lb (78.5 kg)  08/20/24 173 lb 6.4 oz (78.7 kg)    Physical Exam***  Labs reviewed: Basic Metabolic Panel: Recent Labs    07/22/24 1603  NA 138  K 4.4  CL 100  CO2 25  GLUCOSE 89  BUN 19  CREATININE 0.77  CALCIUM  9.6  TSH 1.16   Liver Function Tests: Recent Labs    07/22/24 1603  AST 18  ALT 14  BILITOT 0.3  PROT 7.0   No results for input(s): LIPASE, AMYLASE in the last 8760 hours. No results for input(s): AMMONIA in the last 8760 hours. CBC: Recent Labs    07/22/24 1603  WBC 7.0  NEUTROABS 4,529  HGB 13.3  HCT 40.7  MCV 94.4  PLT 234   Lipid Panel: No  results for input(s): CHOL, HDL, LDLCALC, TRIG, CHOLHDL, LDLDIRECT in the last 8760 hours. TSH: Recent Labs    07/22/24 1603  TSH 1.16   A1C: No results found for: HGBA1C   Assessment/Plan *** There are no diagnoses linked to this encounter.   No follow-ups on file.: ***  Arletta Lumadue K. Caro BODILY [facility] [phone]     [1]  Allergies Allergen Reactions   Amoxicillin-Pot Clavulanate Itching and Rash   Shrimp Extract Nausea  And Vomiting   Codeine Nausea And Vomiting and Nausea Only   "

## 2025-03-12 ENCOUNTER — Ambulatory Visit: Admitting: Nurse Practitioner

## 2025-04-02 ENCOUNTER — Ambulatory Visit: Admitting: Nurse Practitioner
